# Patient Record
Sex: Male | Born: 1985 | Race: White | Hispanic: No | Marital: Married | Smoking: Never smoker
Health system: Southern US, Community
[De-identification: ages and names within clinical notes are randomized; demographics above are authoritative.]

## PROBLEM LIST (undated history)

## (undated) DIAGNOSIS — K219 Gastro-esophageal reflux disease without esophagitis: Secondary | ICD-10-CM

## (undated) DIAGNOSIS — R519 Headache, unspecified: Secondary | ICD-10-CM

## (undated) DIAGNOSIS — R51 Headache: Secondary | ICD-10-CM

## (undated) HISTORY — PX: FRACTURE SURGERY: SHX138

## (undated) HISTORY — PX: WISDOM TOOTH EXTRACTION: SHX21

---

## 2006-07-24 ENCOUNTER — Emergency Department (HOSPITAL_COMMUNITY): Admission: EM | Admit: 2006-07-24 | Discharge: 2006-07-25 | Payer: Self-pay | Admitting: Emergency Medicine

## 2008-04-19 ENCOUNTER — Emergency Department: Payer: Self-pay | Admitting: Emergency Medicine

## 2013-02-27 ENCOUNTER — Encounter: Payer: Self-pay | Admitting: Internal Medicine

## 2013-02-27 ENCOUNTER — Ambulatory Visit (INDEPENDENT_AMBULATORY_CARE_PROVIDER_SITE_OTHER): Payer: BC Managed Care – PPO | Admitting: Internal Medicine

## 2013-02-27 VITALS — BP 130/68 | HR 84 | Temp 98.3°F | Resp 18 | Ht 68.0 in | Wt 257.2 lb

## 2013-02-27 DIAGNOSIS — E669 Obesity, unspecified: Secondary | ICD-10-CM

## 2013-02-27 DIAGNOSIS — R5381 Other malaise: Secondary | ICD-10-CM

## 2013-02-27 DIAGNOSIS — Z13220 Encounter for screening for lipoid disorders: Secondary | ICD-10-CM

## 2013-02-27 DIAGNOSIS — R5383 Other fatigue: Secondary | ICD-10-CM

## 2013-02-27 DIAGNOSIS — R6889 Other general symptoms and signs: Secondary | ICD-10-CM

## 2013-02-27 DIAGNOSIS — R196 Halitosis: Secondary | ICD-10-CM

## 2013-02-27 NOTE — Patient Instructions (Addendum)
Return for fasting labs (make appt w front desk) fasting = no food or caloried beverages for 8 hours prior to blood test   Consider trying a PPR (omeprazole 20 mg or zegerid 20/1100 both available OTC) for reflux and to see if it imprroves your breath  Alternative:  We can schedule a swallow evaluation  To rule out esophageal diverticulum as the cause  You can lose 10%  Of your current body weight over the next 6 months   This is  my version of a  "Low GI"  Diet:  It is not ultra low carb, but will still lower your blood sugars and allow you to lose 5 to 10 lbs per month if you follow it carefully. All of the foods can be found at grocery stores and in bulk at [employer].  The Atkins protein bars and shakes are available in more varieties at [employer], [employer] and [employer].     7 AM Breakfast:  Low carbohydrate Protein  Shakes (I recommend the EAS AdvantEdge "Carb Control" shakes  Or the low carb shakes by Atkins.   Both are available everywhere:  In  cases at [employer]Js  Or in 4 packs at grocery stores and pharmacies  2.5 carbs  (Alternative is  a toasted Arnold's Sandwhich Thin w/ peanut butter, a "Bagel Thin" with cream cheese and salmon) or  a scrambled egg burrito made with a low carb tortilla .  Avoid cereal and bananas, oatmeal too unless you are cooking the old fashioned kind that takes 30-40 minutes to prepare.  the rest is overly processed, has minimal fiber, and is loaded with carbohydrates!   10 AM: Protein bar by Atkins (the snack size, under 200 cal).  There are many varieties , available widely again or in bulk in limited varieties at BJs)  Other so called "protein bars" tend to be loaded with carbohydrates.  Remember, in food advertising, the word "energy" is synonymous for " carbohydrate."  Lunch: sandwich of [place], (or any lunchmeat, grilled meat or canned tuna), fresh avocado, mayonnaise  and cheese on a lower carbohydrate pita bread, flatbread, or tortilla . Ok to use regular  mayonnaise. The bread is the only source or carbohydrate that can be decreased (Joseph's makes a pita bread and a flat bread that are 50 cal and 4 net carbs ; Toufayan makes a low carb flatbread that's 100 cal and 9 net carbs  and  Mission makes a low carb whole wheat tortilla  That is 210 cal and 6 net carbs)  3 PM:  Mid day :  Another protein bar,  Or a  cheese stick (100 cal, 0 carbs),  Or 1 ounce of  almonds, walnuts, pistachios, pecans, peanuts,  Macadamia nuts. Or a Dannon light n Fit greek yogurt, 80 cal 8 net carbs . Avoid "granola"; the dried cranberries and raisins are loaded with carbohydrates. Mixed nuts ok if no raisins or cranberries or dried fruit.      6 PM  Dinner:  "mean and green:"  Meat/chicken/fish or a high protein legume; , with a green salad, and a low GI  Veggie (broccoli, cauliflower, green beans, spinach, brussel sprouts. Lima beans) : Avoid "Low fat dressings, as well as Reyne Dumas and [address]! They are loaded with sugar! Instead use ranch, vinagrette,  Blue cheese, etc.  There is a low carb pasta by Dreamfield's available at [facility] that is acceptable and tastes great. Try Kai Levins Angel's chicken piccata over low carb  pasta. The chicken dish is 0 carbs, and can be found in frozen section at BJs and [employer]. Also try [employer]" (pulled pork, no sauce,  0 carbs) and his pot roast.   both are in the refrigerated section at BJs   Dreamfield's makes a low carb pasta only 5 g/serving.  Available at all grocery stores,  And tastes like normal pasta  9 PM snack : Breyer's "low carb" fudgsicle or  ice cream bar (Carb Smart line), or  Weight Watcher's ice cream bar , or another "no sugar added" ice cream;a serving of fresh berries/cherries with whipped cream (Avoid bananas, pineapple, grapes  and watermelon on a regular basis because they are high in sugar)   Remember that snack Substitutions should be less than 10 carbs per serving and meals < 20 carbs.  Remember to subtract fiber grams and sugar alcohols to get the "net carbs."

## 2013-02-27 NOTE — Progress Notes (Signed)
Patient ID: Perry Morris, male   DOB: Oct 02, 1986, 27 y.o.   MRN: [id number]   .  Patient Active Problem List  Diagnosis  . Obesity, unspecified  . Halitosis    Subjective:  CC:   Chief Complaint  Patient presents with  . Establish Care    HPI:   Perry Morris is a 27 y.o. male who presents as a new patient to establish primary care with the chief complaint of    Weight gain..  Patient states that this is her heaviest weight he has ever been.  He has had weight problems in the past but has been able to lose 20 lbs  With diet and exercise .  He cites recent fatherhood and change in activity level as contributors. He also has had a job which includes quite a bit of travel which only has recently changed since he has become a father.  He denies dry skin , constipation and other thyroid symptoms. He has no joint pain snoring or daytime somnolence.  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   2) halitosis. He is continually bothered by his address and his wife does notice it as well. He has had both ENT and dental evaluations and found no social support. He does brush his tongue regularly when he brushes his teeth he also losses. .      History reviewed. No pertinent past medical history.  Past Surgical History  Procedure Laterality Date  . Wisdom tooth extraction      Family History  Problem Relation Age of Onset  . Stroke Father 65    thrombotic,  carotid artery   . Cancer Brother 16    lymphoma    History   Social History  . Marital Status: Single    Spouse Name: N/A    Number of Children: N/A  . Years of Education: N/A   Occupational History  . Not on file.   Social History Main Topics  . Smoking status: Never Smoker   . Smokeless tobacco: Not on file  . Alcohol Use: Yes  . Drug Use: No  . Sexually Active: Not on file   Other Topics Concern  .  Not on file   Social History Narrative  . No narrative on file     No Known Allergies    Review of Systems:   Patient denies headache, fevers, malaise, unintentional weight loss, skin rash, eye pain, sinus congestion and sinus pain, sore throat, dysphagia,  hemoptysis , cough, dyspnea, wheezing, chest pain, palpitations, orthopnea, edema, abdominal pain, nausea, melena, diarrhea, constipation, flank pain, dysuria, hematuria, urinary  Frequency, nocturia, numbness, tingling, seizures,  Focal weakness, Loss of consciousness,  Tremor, insomnia, depression, anxiety, and suicidal ideation.     Objective:  BP 130/68  Pulse 84  Temp(Src) 98.3 F (36.8 C) (Oral)  Resp 18  Ht 5\' 8"  (1.727 m)  Wt 257 lb 4 oz (116.688 kg)  BMI 39.12 kg/m2  SpO2 98%  General appearance: alert, cooperative and appears stated age Ears: normal TM's and external ear canals both ears Throat: lips, mucosa, and tongue normal; teeth and gums normal Neck: no adenopathy, no carotid bruit, supple, symmetrical, trachea midline and thyroid not enlarged, symmetric, no tenderness/mass/nodules Back: symmetric, no curvature. ROM normal. No CVA tenderness. Lungs: clear to auscultation bilaterally Heart: regular rate and rhythm, S1, S2 normal, no murmur,  click, rub or gallop Abdomen: soft, non-tender; bowel sounds normal; no masses,  no organomegaly Pulses: 2+ and symmetric Skin: Skin color, texture, turgor normal. No rashes or lesions Lymph nodes: Cervical, supraclavicular, and axillary nodes normal.  Assessment and Plan:  Obesity, unspecified BMI is 39.I have addressed  BMI and recommended a low glycemic index diet utilizing smaller more frequent meals to increase metabolism.  I have also recommended that patient start exercising with a goal of 30 minutes of aerobic exercise a minimum of 5 days per week. Screening for lipid disorders, thyroid and diabetes to be done today.    Halitosis Persistent, despite regular  dental evaluations. He brushes his tongue when he brushes his teeth. He has no history of 2 CK. He has been doing some reading online and Is inquiring about whether changing his pH will help [place] halitosis,  I've reassured him that his body has a pH that is important for maintenance of normal function and should not be altered with medications. I have suggested that he try a refund flux medication given that he does have episodes of reflux. If trial of PPI does not help we discussed ordering a  UGI study to rule out esophageal diverticulum.   Updated Medication List No outpatient encounter prescriptions on file as of 02/27/2013.   No facility-administered encounter medications on file as of 02/27/2013.     Orders Placed This Encounter  Procedures  . Comprehensive metabolic panel  . TSH  . Lipid panel  . CBC with Differential    No Follow-up on file.

## 2013-02-28 ENCOUNTER — Encounter: Payer: Self-pay | Admitting: Internal Medicine

## 2013-02-28 DIAGNOSIS — R196 Halitosis: Secondary | ICD-10-CM | POA: Insufficient documentation

## 2013-02-28 DIAGNOSIS — E669 Obesity, unspecified: Secondary | ICD-10-CM | POA: Insufficient documentation

## 2013-02-28 NOTE — Assessment & Plan Note (Addendum)
Persistent, despite regular dental evaluations. He brushes his tongue when he brushes his teeth. He has no history of 2 CK. He has been doing some reading online and Is inquiring about whether changing his pH will help [place] halitosis,  I've reassured him that his body has a pH that is important for maintenance of normal function and should not be altered with medications. I have suggested that he try a refund flux medication given that he does have episodes of reflux. If trial of PPI does not help we discussed ordering a  UGI study to rule out esophageal diverticulum.

## 2013-02-28 NOTE — Assessment & Plan Note (Signed)
BMI is 39.I have addressed  BMI and recommended a low glycemic index diet utilizing smaller more frequent meals to increase metabolism.  I have also recommended that patient start exercising with a goal of 30 minutes of aerobic exercise a minimum of 5 days per week. Screening for lipid disorders, thyroid and diabetes to be done today.

## 2013-03-17 ENCOUNTER — Other Ambulatory Visit (INDEPENDENT_AMBULATORY_CARE_PROVIDER_SITE_OTHER): Payer: BC Managed Care – PPO

## 2013-03-17 DIAGNOSIS — R5383 Other fatigue: Secondary | ICD-10-CM

## 2013-03-17 DIAGNOSIS — Z13220 Encounter for screening for lipoid disorders: Secondary | ICD-10-CM

## 2013-03-17 DIAGNOSIS — E669 Obesity, unspecified: Secondary | ICD-10-CM

## 2013-03-17 DIAGNOSIS — R5381 Other malaise: Secondary | ICD-10-CM

## 2013-03-17 LAB — CBC WITH DIFFERENTIAL/PLATELET
Basophils Absolute: 0 10*3/uL (ref 0.0–0.1)
Basophils Relative: 0.5 % (ref 0.0–3.0)
Eosinophils Absolute: 0.1 10*3/uL (ref 0.0–0.7)
HCT: 45.2 % (ref 39.0–52.0)
Hemoglobin: 15.7 g/dL (ref 13.0–17.0)
Lymphs Abs: 2.4 10*3/uL (ref 0.7–4.0)
MCHC: 34.7 g/dL (ref 30.0–36.0)
MCV: 88.4 fl (ref 78.0–100.0)
Monocytes Absolute: 0.6 10*3/uL (ref 0.1–1.0)
Neutro Abs: 3 10*3/uL (ref 1.4–7.7)
RBC: 5.11 Mil/uL (ref 4.22–5.81)
RDW: 12.8 % (ref 11.5–14.6)

## 2013-03-17 LAB — LIPID PANEL
Cholesterol: 151 mg/dL (ref 0–200)
LDL Cholesterol: 91 mg/dL (ref 0–99)
Triglycerides: 71 mg/dL (ref 0.0–149.0)

## 2013-03-17 LAB — COMPREHENSIVE METABOLIC PANEL
AST: 24 U/L (ref 0–37)
BUN: 14 mg/dL (ref 6–23)
Calcium: 9 mg/dL (ref 8.4–10.5)
Chloride: 105 mEq/L (ref 96–112)
Creatinine, Ser: 1.1 mg/dL (ref 0.4–1.5)
GFR: 86.17 mL/min (ref >60.00)
Glucose, Bld: 99 mg/dL (ref 70–99)

## 2013-03-17 LAB — TSH: TSH: 1.37 u[IU]/mL (ref 0.35–5.50)

## 2013-03-18 ENCOUNTER — Encounter: Payer: Self-pay | Admitting: Internal Medicine

## 2013-03-19 ENCOUNTER — Encounter: Payer: Self-pay | Admitting: Internal Medicine

## 2013-03-21 ENCOUNTER — Encounter: Payer: Self-pay | Admitting: Internal Medicine

## 2013-03-27 NOTE — Telephone Encounter (Signed)
Patient has appointment scheduled 10/14.

## 2013-08-01 ENCOUNTER — Ambulatory Visit (INDEPENDENT_AMBULATORY_CARE_PROVIDER_SITE_OTHER): Payer: BC Managed Care – PPO | Admitting: Internal Medicine

## 2013-08-01 ENCOUNTER — Encounter: Payer: Self-pay | Admitting: Internal Medicine

## 2013-08-01 VITALS — BP 120/80 | HR 80 | Temp 98.8°F | Resp 14 | Ht 68.0 in | Wt 253.0 lb

## 2013-08-01 DIAGNOSIS — B3749 Other urogenital candidiasis: Secondary | ICD-10-CM

## 2013-08-01 DIAGNOSIS — B3742 Candidal balanitis: Secondary | ICD-10-CM

## 2013-08-01 DIAGNOSIS — Z23 Encounter for immunization: Secondary | ICD-10-CM

## 2013-08-01 MED ORDER — NYSTATIN 100000 UNIT/GM EX POWD: CUTANEOUS | Status: DC

## 2013-08-01 MED ORDER — SULFAMETHOXAZOLE-TMP DS 800-160 MG PO TABS: 1.0000 | ORAL_TABLET | Freq: Two times a day (BID) | ORAL | Status: DC

## 2013-08-01 MED ORDER — FLUCONAZOLE 150 MG PO TABS: 150.0000 mg | ORAL_TABLET | Freq: Every day | ORAL | Status: DC

## 2013-08-01 NOTE — Patient Instructions (Addendum)
I am treating you for a fungal infection with oral and topical medications  The septra should be taken after you take the 2 days of fluconazole  This will cover any bacteraial skin infection   You can try using cool compresses for the pain if needed

## 2013-08-03 DIAGNOSIS — B3742 Candidal balanitis: Secondary | ICD-10-CM | POA: Insufficient documentation

## 2013-08-03 NOTE — Progress Notes (Signed)
Patient ID: Perry Morris, male   DOB: Aug 25, 1986, 27 y.o.   MRN: [id number]  Patient Active Problem List   Diagnosis Date Noted  . Candidal balanitis 08/03/2013  . Obesity, unspecified 02/28/2013  . Halitosis 02/28/2013    Subjective:  CC:   Chief Complaint  Patient presents with  . Acute Visit  . Rash    groin area    HPI:   Perry Morris a 27 y.o. male who presents Penile irritation. Patient states that for the last several weeks he has noticed irritation and redness along the shaft of his penis that irritation has been done right painful at times. He does wear spandex style underwear and sleeps in at night as well. He does exercise regularly and has noted that he stays moist that area due to exercise. He's tried some over-the-counter antifungals with no improvement. He is sexually active but he has only one partner and his wife and he just recently a child. There's no marital infidelity suspected. He denies any history of herpes. He's noticed no blistering lesions.    No past medical history on file.  Past Surgical History  Procedure Laterality Date  . Wisdom tooth extraction         The following portions of the patient's history were reviewed and updated as appropriate: Allergies, current medications, and problem list.    Review of Systems:   12 Pt  review of systems was negative except those addressed in the HPI,     History   Social History  . Marital Status: Single    Spouse Name: N/A    Number of Children: N/A  . Years of Education: N/A   Occupational History  . Not on file.   Social History Main Topics  . Smoking status: Never Smoker   . Smokeless tobacco: Never Used  . Alcohol Use: Yes  . Drug Use: No  . Sexual Activity: Not on file   Other Topics Concern  . Not on file   Social History Narrative  . No narrative on file    Objective:  Filed Vitals:   08/01/13 1632  BP: 120/80  Pulse: 80  Temp: 98.8 F (37.1 C)  Resp:  14     General appearance: alert, cooperative and appears stated age Ears: normal TM's and external ear canals both ears Throat: lips, mucosa, and tongue normal; teeth and gums normal Neck: no adenopathy, no carotid bruit, supple, symmetrical, trachea midline and thyroid not enlarged, symmetric, no tenderness/mass/nodules Back: symmetric, no curvature. ROM normal. No CVA tenderness. Lungs: clear to auscultation bilaterally Heart: regular rate and rhythm, S1, S2 normal, no murmur, click, rub or gallop Abdomen: soft, non-tender; bowel sounds normal; no masses,  no organomegaly Pulses: 2+ and symmetric Skin: nonvesicular erythematous rash on shaft of penis.  No inguinal LAD>  Lymph nodes: Cervical, supraclavicular, and axillary nodes normal.  Assessment and Plan:  Candidal balanitis We'll treat empirically with 2 days of oral fluconazole followed by use of nystatin powder to 4 times daily. Will cover also for MRSA cellulitis  since his wife is a Scientist, forensic and he works out at a gym.with one week of empiric Septra. Advised to switch to cotton underwear to avoid persistent dampness of inguinal area.   Updated Medication List Outpatient Encounter Prescriptions as of 08/01/2013  Medication Sig Dispense Refill  . fluconazole (DIFLUCAN) 150 MG tablet Take 1 tablet (150 mg total) by mouth daily.  2 tablet  0  . nystatin (MYCOSTATIN/NYSTOP)  100000 UNIT/GM POWD Apply topically to affected area twice daily  30 g  1  . sulfamethoxazole-trimethoprim (BACTRIM DS) 800-160 MG per tablet Take 1 tablet by mouth 2 (two) times daily.  14 tablet  0   No facility-administered encounter medications on file as of 08/01/2013.     Orders Placed This Encounter  Procedures  . Flu Vaccine QUAD 36+ mos PF IM (Fluarix)    No Follow-up on file.

## 2013-08-03 NOTE — Assessment & Plan Note (Signed)
We'll treat empirically with 2 days of oral fluconazole followed by use of nystatin powder to 4 times daily. Will cover also for MRSA cellulitis  since his wife is a Scientist, forensic and he works out at a gym.with one week of empiric Septra.

## 2013-08-04 ENCOUNTER — Encounter: Payer: Self-pay | Admitting: Internal Medicine

## 2013-08-05 ENCOUNTER — Encounter: Payer: Self-pay | Admitting: Internal Medicine

## 2013-08-05 DIAGNOSIS — B3742 Candidal balanitis: Secondary | ICD-10-CM

## 2013-08-05 MED ORDER — ACYCLOVIR 400 MG PO TABS: 400.0000 mg | ORAL_TABLET | Freq: Three times a day (TID) | ORAL | Status: DC

## 2013-08-05 MED ORDER — PREDNISONE (PAK) 10 MG PO TABS: ORAL_TABLET | ORAL | Status: DC

## 2013-08-05 NOTE — Assessment & Plan Note (Signed)
No improvement in 72 hours with bactrim and nystatin.  Changing to prednisone and acyclovir

## 2013-08-29 ENCOUNTER — Ambulatory Visit: Payer: BC Managed Care – PPO | Admitting: Internal Medicine

## 2016-07-03 ENCOUNTER — Other Ambulatory Visit: Payer: Self-pay | Admitting: Orthopedic Surgery

## 2016-07-03 DIAGNOSIS — S8263XA Displaced fracture of lateral malleolus of unspecified fibula, initial encounter for closed fracture: Secondary | ICD-10-CM | POA: Insufficient documentation

## 2016-07-11 ENCOUNTER — Encounter
Admission: RE | Admit: 2016-07-11 | Discharge: 2016-07-11 | Disposition: A | Discharge status: Home / Self Care | Payer: BLUE CROSS/BLUE SHIELD | Source: Ambulatory Visit | Attending: Orthopedic Surgery | Admitting: Orthopedic Surgery

## 2016-07-11 HISTORY — DX: Headache, unspecified: R51.9

## 2016-07-11 HISTORY — DX: Headache: R51

## 2016-07-11 HISTORY — DX: Gastro-esophageal reflux disease without esophagitis: K21.9

## 2016-07-11 NOTE — Patient Instructions (Signed)
  Your procedure is scheduled on: 07-13-16 [facility](THURSDAY) Report to Same Day Surgery 2nd floor medical mall To find out your arrival time please call [phone] between 1PM - 3PM on 07-12-16 [facility](WEDNESDAY)  Remember: Instructions that are not followed completely may result in serious medical risk, up to and including death, or upon the discretion of your surgeon and anesthesiologist your surgery may need to be rescheduled.    _x___ 1. Do not eat food or drink liquids after midnight. No gum chewing or hard candies.     __x__ 2. No Alcohol for 24 hours before or after surgery.   __x__3. No Smoking for 24 prior to surgery.   ____  4. Bring all medications with you on the day of surgery if instructed.    __x__ 5. Notify your doctor if there is any change in your medical condition     (cold, fever, infections).     Do not wear jewelry, make-up, hairpins, clips or nail polish.  Do not wear lotions, powders, or perfumes. You may wear deodorant.  Do not shave 48 hours prior to surgery. Men may shave face and neck.  Do not bring valuables to the hospital.    [facility] any belongings or valuables.               Contacts, dentures or bridgework may not be worn into surgery.  Leave your suitcase in the car. After surgery it may be brought to your room.  For patients admitted to the hospital, discharge time is determined by your treatment team.   Patients discharged the day of surgery will not be allowed to drive home.    Please read over the following fact sheets that you were given:   [facility] Preparing for Surgery and or MRSA Information   ____ Take these medicines the morning of surgery with A SIP OF WATER:    1. NONE  2.  3.  4.  5.  6.  ____ Fleet Enema (as directed)   _x___ Use CHG Soap or sage wipes as directed on instruction sheet   ____ Use inhalers on the day of surgery and bring to hospital day of surgery  ____ Stop metformin 2 days prior to  surgery    ____ Take 1/2 of usual insulin dose the night before surgery and none on the morning of  surgery.   ____ Stop aspirin or coumadin, or plavix  _x__ Stop Anti-inflammatories such as Advil, Aleve, Ibuprofen, Motrin, Naproxen,          Naprosyn, Goodies powders or aspirin products. Ok to take Tylenol.   ____ Stop supplements until after surgery.    ____ Bring C-Pap to the hospital.

## 2016-07-12 ENCOUNTER — Encounter
Admission: RE | Admit: 2016-07-12 | Discharge: 2016-07-12 | Disposition: A | Discharge status: Home / Self Care | Payer: BLUE CROSS/BLUE SHIELD | Source: Ambulatory Visit | Attending: Orthopedic Surgery | Admitting: Orthopedic Surgery

## 2016-07-12 DIAGNOSIS — S8262XA Displaced fracture of lateral malleolus of left fibula, initial encounter for closed fracture: Secondary | ICD-10-CM | POA: Diagnosis present

## 2016-07-12 DIAGNOSIS — X501XXA Overexertion from prolonged static or awkward postures, initial encounter: Secondary | ICD-10-CM | POA: Diagnosis not present

## 2016-07-12 DIAGNOSIS — K219 Gastro-esophageal reflux disease without esophagitis: Secondary | ICD-10-CM | POA: Diagnosis not present

## 2016-07-12 DIAGNOSIS — G43909 Migraine, unspecified, not intractable, without status migrainosus: Secondary | ICD-10-CM | POA: Diagnosis not present

## 2016-07-12 DIAGNOSIS — Z823 Family history of stroke: Secondary | ICD-10-CM | POA: Diagnosis not present

## 2016-07-12 DIAGNOSIS — Y9379 Activity, other specified sports and athletics: Secondary | ICD-10-CM | POA: Diagnosis not present

## 2016-07-12 DIAGNOSIS — Z807 Family history of other malignant neoplasms of lymphoid, hematopoietic and related tissues: Secondary | ICD-10-CM | POA: Diagnosis not present

## 2016-07-12 DIAGNOSIS — S93432A Sprain of tibiofibular ligament of left ankle, initial encounter: Secondary | ICD-10-CM | POA: Diagnosis not present

## 2016-07-12 DIAGNOSIS — Y929 Unspecified place or not applicable: Secondary | ICD-10-CM | POA: Diagnosis not present

## 2016-07-12 LAB — BASIC METABOLIC PANEL
Anion gap: 5 (ref 5–15)
BUN: 22 mg/dL — ABNORMAL HIGH (ref 6–20)
CALCIUM: 9.3 mg/dL (ref 8.9–10.3)
CO2: 29 mmol/L (ref 22–32)
CREATININE: 1.15 mg/dL (ref 0.61–1.24)
Chloride: 104 mmol/L (ref 101–111)
GFR calc non Af Amer: >60 mL/min (ref >60)
Glucose, Bld: 93 mg/dL (ref 65–99)
Potassium: 4 mmol/L (ref 3.5–5.1)
SODIUM: 138 mmol/L (ref 135–145)

## 2016-07-12 LAB — CBC WITH DIFFERENTIAL/PLATELET
BASOS PCT: 0 %
Basophils Absolute: 0 10*3/uL (ref 0–0.1)
EOS ABS: 0.1 10*3/uL (ref 0–0.7)
Eosinophils Relative: 1 %
HCT: 45.4 % (ref 40.0–52.0)
HEMOGLOBIN: 16 g/dL (ref 13.0–18.0)
Lymphocytes Relative: 25 %
Lymphs Abs: 2.3 10*3/uL (ref 1.0–3.6)
MCH: 30.7 pg (ref 26.0–34.0)
MCHC: 35.3 g/dL (ref 32.0–36.0)
MCV: 87 fL (ref 80.0–100.0)
MONOS PCT: 8 %
Monocytes Absolute: 0.7 10*3/uL (ref 0.2–1.0)
NEUTROS PCT: 66 %
Neutro Abs: 6 10*3/uL (ref 1.4–6.5)
Platelets: 251 10*3/uL (ref 150–440)
RBC: 5.22 MIL/uL (ref 4.40–5.90)
RDW: 12.8 % (ref 11.5–14.5)
WBC: 9.2 10*3/uL (ref 3.8–10.6)

## 2016-07-12 LAB — PROTIME-INR
INR: 0.91
PROTHROMBIN TIME: 12.2 s (ref 11.4–15.2)

## 2016-07-12 LAB — APTT: APTT: 28 s (ref 24–36)

## 2016-07-13 ENCOUNTER — Ambulatory Visit
Admission: RE | Admit: 2016-07-13 | Discharge: 2016-07-13 | Disposition: A | Discharge status: Home / Self Care | Payer: BLUE CROSS/BLUE SHIELD | Source: Ambulatory Visit | Attending: Orthopedic Surgery | Admitting: Orthopedic Surgery

## 2016-07-13 ENCOUNTER — Encounter
Admission: RE | Disposition: A | Discharge status: Home / Self Care | Payer: Self-pay | Source: Ambulatory Visit | Attending: Orthopedic Surgery

## 2016-07-13 ENCOUNTER — Ambulatory Visit: Payer: BLUE CROSS/BLUE SHIELD

## 2016-07-13 ENCOUNTER — Encounter: Payer: Self-pay | Admitting: *Deleted

## 2016-07-13 ENCOUNTER — Ambulatory Visit: Payer: BLUE CROSS/BLUE SHIELD | Admitting: Certified Registered Nurse Anesthetist

## 2016-07-13 DIAGNOSIS — Z807 Family history of other malignant neoplasms of lymphoid, hematopoietic and related tissues: Secondary | ICD-10-CM | POA: Insufficient documentation

## 2016-07-13 DIAGNOSIS — Y929 Unspecified place or not applicable: Secondary | ICD-10-CM | POA: Insufficient documentation

## 2016-07-13 DIAGNOSIS — X501XXA Overexertion from prolonged static or awkward postures, initial encounter: Secondary | ICD-10-CM | POA: Insufficient documentation

## 2016-07-13 DIAGNOSIS — S82892A Other fracture of left lower leg, initial encounter for closed fracture: Secondary | ICD-10-CM

## 2016-07-13 DIAGNOSIS — K219 Gastro-esophageal reflux disease without esophagitis: Secondary | ICD-10-CM | POA: Insufficient documentation

## 2016-07-13 DIAGNOSIS — Y9379 Activity, other specified sports and athletics: Secondary | ICD-10-CM | POA: Insufficient documentation

## 2016-07-13 DIAGNOSIS — Z823 Family history of stroke: Secondary | ICD-10-CM | POA: Insufficient documentation

## 2016-07-13 DIAGNOSIS — G43909 Migraine, unspecified, not intractable, without status migrainosus: Secondary | ICD-10-CM | POA: Insufficient documentation

## 2016-07-13 DIAGNOSIS — S8262XA Displaced fracture of lateral malleolus of left fibula, initial encounter for closed fracture: Secondary | ICD-10-CM | POA: Diagnosis not present

## 2016-07-13 DIAGNOSIS — S93432A Sprain of tibiofibular ligament of left ankle, initial encounter: Secondary | ICD-10-CM | POA: Insufficient documentation

## 2016-07-13 HISTORY — PX: ORIF ANKLE FRACTURE: SHX5408

## 2016-07-13 SURGERY — OPEN REDUCTION INTERNAL FIXATION (ORIF) ANKLE FRACTURE
Anesthesia: General | Laterality: Left

## 2016-07-13 MED ORDER — BUPIVACAINE HCL (PF) 0.25 % IJ SOLN
INTRAMUSCULAR | Status: AC
  Filled 2016-07-13: qty 30

## 2016-07-13 MED ORDER — NEOMYCIN-POLYMYXIN B GU 40-200000 IR SOLN
Status: DC | PRN
  Administered 2016-07-13: 4 mL

## 2016-07-13 MED ORDER — FENTANYL CITRATE (PF) 100 MCG/2ML IJ SOLN
INTRAMUSCULAR | Status: AC
  Administered 2016-07-13: 25 ug via INTRAVENOUS
  Filled 2016-07-13: qty 2

## 2016-07-13 MED ORDER — ONDANSETRON HCL 4 MG/2ML IJ SOLN: 4.0000 mg | Freq: Once | INTRAMUSCULAR | Status: DC | PRN

## 2016-07-13 MED ORDER — LIDOCAINE HCL (CARDIAC) 20 MG/ML IV SOLN
INTRAVENOUS | Status: DC | PRN
  Administered 2016-07-13: 30 mg via INTRAVENOUS

## 2016-07-13 MED ORDER — ACETAMINOPHEN 10 MG/ML IV SOLN
INTRAVENOUS | Status: DC | PRN
  Administered 2016-07-13: 1000 mg via INTRAVENOUS

## 2016-07-13 MED ORDER — OXYCODONE HCL 5 MG PO TABS
ORAL_TABLET | ORAL | Status: AC
  Administered 2016-07-13: 10 mg
  Filled 2016-07-13: qty 2

## 2016-07-13 MED ORDER — BUPIVACAINE HCL 0.25 % IJ SOLN
INTRAMUSCULAR | Status: DC | PRN
  Administered 2016-07-13: 30 mL

## 2016-07-13 MED ORDER — ONDANSETRON HCL 4 MG PO TABS: 4.0000 mg | ORAL_TABLET | Freq: Three times a day (TID) | ORAL | 0 refills | Status: DC | PRN

## 2016-07-13 MED ORDER — NEOMYCIN-POLYMYXIN B GU 40-200000 IR SOLN
Status: AC
  Filled 2016-07-13: qty 4

## 2016-07-13 MED ORDER — MIDAZOLAM HCL 5 MG/5ML IJ SOLN
INTRAMUSCULAR | Status: DC | PRN
  Administered 2016-07-13: 1 mg via INTRAVENOUS

## 2016-07-13 MED ORDER — FAMOTIDINE 20 MG PO TABS
ORAL_TABLET | ORAL | Status: AC
  Filled 2016-07-13: qty 1

## 2016-07-13 MED ORDER — CEFAZOLIN SODIUM-DEXTROSE 2-4 GM/100ML-% IV SOLN
2.0000 g | INTRAVENOUS | Status: AC
  Administered 2016-07-13: 2 g via INTRAVENOUS

## 2016-07-13 MED ORDER — CHLORHEXIDINE GLUCONATE CLOTH 2 % EX PADS: 6.0000 | MEDICATED_PAD | Freq: Once | CUTANEOUS | Status: DC

## 2016-07-13 MED ORDER — FAMOTIDINE 20 MG PO TABS
20.0000 mg | ORAL_TABLET | Freq: Once | ORAL | Status: AC
  Administered 2016-07-13: 20 mg via ORAL

## 2016-07-13 MED ORDER — ACETAMINOPHEN 10 MG/ML IV SOLN
INTRAVENOUS | Status: AC
  Filled 2016-07-13: qty 100

## 2016-07-13 MED ORDER — FENTANYL CITRATE (PF) 100 MCG/2ML IJ SOLN
INTRAMUSCULAR | Status: DC | PRN
  Administered 2016-07-13 (×6): 50 ug via INTRAVENOUS

## 2016-07-13 MED ORDER — FENTANYL CITRATE (PF) 100 MCG/2ML IJ SOLN
25.0000 ug | INTRAMUSCULAR | Status: DC | PRN
  Administered 2016-07-13 (×5): 25 ug via INTRAVENOUS

## 2016-07-13 MED ORDER — LACTATED RINGERS IV SOLN
INTRAVENOUS | Status: DC
  Administered 2016-07-13: 10:00:00 via INTRAVENOUS

## 2016-07-13 MED ORDER — ASPIRIN EC 325 MG PO TBEC: 325.0000 mg | DELAYED_RELEASE_TABLET | Freq: Two times a day (BID) | ORAL | 0 refills | Status: DC

## 2016-07-13 MED ORDER — ONDANSETRON HCL 4 MG/2ML IJ SOLN
INTRAMUSCULAR | Status: DC | PRN
  Administered 2016-07-13: 4 mg via INTRAVENOUS

## 2016-07-13 MED ORDER — DEXAMETHASONE SODIUM PHOSPHATE 10 MG/ML IJ SOLN
INTRAMUSCULAR | Status: DC | PRN
  Administered 2016-07-13: 10 mg via INTRAVENOUS

## 2016-07-13 MED ORDER — OXYCODONE HCL 5 MG PO TABS: 5.0000 mg | ORAL_TABLET | ORAL | 0 refills | Status: DC | PRN

## 2016-07-13 MED ORDER — PROPOFOL 10 MG/ML IV BOLUS
INTRAVENOUS | Status: DC | PRN
  Administered 2016-07-13: 200 mg via INTRAVENOUS

## 2016-07-13 MED ORDER — CEFAZOLIN SODIUM-DEXTROSE 2-4 GM/100ML-% IV SOLN
INTRAVENOUS | Status: AC
  Filled 2016-07-13: qty 100

## 2016-07-13 SURGICAL SUPPLY — 51 items
BANDAGE ELASTIC 4 LF NS (GAUZE/BANDAGES/DRESSINGS) ×6 IMPLANT
BLADE SURG 15 STRL LF DISP TIS (BLADE) ×1 IMPLANT
BLADE SURG 15 STRL SS (BLADE) ×3
BNDG ADH 5X4 AIR PERM ELC (GAUZE/BANDAGES/DRESSINGS) ×1
BNDG CMPR MED 5X4 ELC HKLP NS (GAUZE/BANDAGES/DRESSINGS) ×2
BNDG COHESIVE 4X5 WHT NS (GAUZE/BANDAGES/DRESSINGS) ×2 IMPLANT
BNDG ESMARK 6X12 TAN STRL LF (GAUZE/BANDAGES/DRESSINGS) ×3 IMPLANT
CLOSURE WOUND 1/2 X4 (GAUZE/BANDAGES/DRESSINGS)
CUFF TOURN 24 STER (MISCELLANEOUS) IMPLANT
CUFF TOURN 30 STER DUAL PORT (MISCELLANEOUS) ×2 IMPLANT
DRAPE FLUOR MINI C-ARM 54X84 (DRAPES) ×3 IMPLANT
DRAPE INCISE IOBAN 66X45 STRL (DRAPES) ×3 IMPLANT
DRAPE U-SHAPE 47X51 STRL (DRAPES) ×3 IMPLANT
DURAPREP 26ML APPLICATOR (WOUND CARE) ×6 IMPLANT
GAUZE PETRO XEROFOAM 1X8 (MISCELLANEOUS) ×3 IMPLANT
GAUZE SPONGE 4X4 12PLY STRL (GAUZE/BANDAGES/DRESSINGS) ×3 IMPLANT
GAUZE XEROFORM 4X4 STRL (GAUZE/BANDAGES/DRESSINGS) ×2 IMPLANT
GLOVE BIOGEL PI IND STRL 9 (GLOVE) ×1 IMPLANT
GLOVE BIOGEL PI INDICATOR 9 (GLOVE) ×2
GLOVE SURG 9.0 ORTHO LTXF (GLOVE) ×18 IMPLANT
GOWN STRL REUS TWL 2XL XL LVL4 (GOWN DISPOSABLE) ×3 IMPLANT
GOWN STRL REUS W/ TWL LRG LVL3 (GOWN DISPOSABLE) ×1 IMPLANT
GOWN STRL REUS W/TWL LRG LVL3 (GOWN DISPOSABLE) ×9
KIT RM TURNOVER STRD PROC AR (KITS) ×3 IMPLANT
LABEL OR SOLS (LABEL) ×3 IMPLANT
NS IRRIG 1000ML POUR BTL (IV SOLUTION) ×1 IMPLANT
PACK EXTREMITY ARMC (MISCELLANEOUS) ×3 IMPLANT
PAD ABD DERMACEA PRESS 5X9 (GAUZE/BANDAGES/DRESSINGS) ×6 IMPLANT
PAD CAST CTTN 4X4 STRL (SOFTGOODS) ×3 IMPLANT
PADDING CAST COTTON 4X4 STRL (SOFTGOODS) ×6
PLATE LCP 3.5 1/3 TUB 8HX93 (Plate) ×2 IMPLANT
REPAIR TROPE KNTLS SS SYNDESMO (Orthopedic Implant) ×2 IMPLANT
SCREW CANC FT 4.0X20 (Screw) ×4 IMPLANT
SCREW CANC FT ST SFS 4X16 (Screw) ×2 IMPLANT
SCREW CORTEX 3.5 12MM (Screw) ×2 IMPLANT
SCREW CORTEX 3.5 14MM (Screw) ×4 IMPLANT
SCREW CORTEX 3.5 16MM (Screw) ×2 IMPLANT
SCREW CORTEX 3.5 20MM (Screw) ×2 IMPLANT
SCREW LOCK CORT ST 3.5X12 (Screw) IMPLANT
SCREW LOCK CORT ST 3.5X14 (Screw) IMPLANT
SCREW LOCK CORT ST 3.5X16 (Screw) IMPLANT
SCREW LOCK CORT ST 3.5X20 (Screw) IMPLANT
SPLINT CAST 1 STEP 4X30 (MISCELLANEOUS) ×6 IMPLANT
SPONGE LAP 18X18 5 PK (GAUZE/BANDAGES/DRESSINGS) ×1 IMPLANT
STAPLER SKIN PROX 35W (STAPLE) ×3 IMPLANT
STOCKINETTE STRL 6IN 960660 (GAUZE/BANDAGES/DRESSINGS) ×3 IMPLANT
STRIP CLOSURE SKIN 1/2X4 (GAUZE/BANDAGES/DRESSINGS) ×2 IMPLANT
SUT VIC AB 2-0 SH 27 (SUTURE) ×3
SUT VIC AB 2-0 SH 27XBRD (SUTURE) ×2 IMPLANT
SYR 30ML LL (SYRINGE) ×1 IMPLANT
TAPE MICROPORE 2IN (TAPE) ×3 IMPLANT

## 2016-07-13 NOTE — Op Note (Signed)
07/13/2016  2:01 PM  PATIENT:  Perry Morris    PRE-OPERATIVE DIAGNOSIS:  Displaced fracture of the left lateral malleolus with syndesmotic disruption  POST-OPERATIVE DIAGNOSIS:  Same  PROCEDURE:  OPEN REDUCTION INTERNAL FIXATION (ORIF) LEFT ANKLE FRACTURE AND FIXATION OF LEFT SYNDESMOTIC DISRUPTION  SURGEON:  Thornton Park, MD  ASSISTANT:  Rockwell Germany, PA-S  [facility]  ANESTHESIA:   General and local with 0.25% marcaine plain    PREOPERATIVE INDICATIONS:  Perry Morris is a  30 y.o. male with a displaced fracture of the left lateral malleolus with syndesmotic disruption.  He has been having significant left ankle pain and instability. He has been unable to bear weight on the left lower extremity. This is affecting his activities of daily living and his ability to perform his job. I have recommended operative fixation for this injury and he has agreed. I have explained the details of the operation as well as the postoperative course.  I also discussed with him the risks and benefits of surgery. He understands the risks include but are not limited to infection, bleeding requiring blood transfusion, nerve or blood vessel injury, joint stiffness or loss of motion, persistent pain, weakness or instability, malunion, nonunion and hardware failure and the need for further surgery. Medical risks include but are not limited to DVT and pulmonary embolism, myocardial infarction, stroke, pneumonia, respiratory failure and death. Patient understood these risks and wished to proceed.   OPERATIVE IMPLANTS: Synthes 8 hole one third tubular plate and Arthrex ankle tightrope  OPERATIVE FINDINGS: Displaced oblique lateral malleolus fracture with instability of the syndesmosis  OPERATIVE PROCEDURE:   Patient was met in the preoperative area. The left leg was signed my initials and the word yes according the hospital's correct site of surgery protocol. He was brought to the operating room where he  underwent general anesthesia after an unsuccessful attempt at spinal placement. The patient was placed supine on the operative table. A bump was placed under the left hip. A tourniquet was applied to the left thigh.  The lower extremity was prepped and draped in a sterile fashion. A timeout was performed to verify the patient's name, date of birth, medical record number, correct site of surgery and correct procedure to be performed. It was also used to verify the patient received antibiotics, and that all appropriate instruments, implants and radiographic studies were available in the room. Once all in attendance were in agreement, the case began.  The left lower extremity was exsanguinated with an Esmarch. The tourniquet was inflated to 275 mmHg.  A lateral incision was made over the fibula. The subcutaneous tissues were dissected with the Metzenbaum scissor and pickup. Care was taken to avoid injury to the superficial peroneal nerve. The lateral malleolus fracture was identified and irrigated and fracture hematoma was removed. A curet was used to remove all scar tissue from the fracture site  Soft tissue was removed from the edges of the fracture site using a periosteal elevator. A fracture reduction clamp was then used to reduce the fracture to an anatomic position.  FluoroScan images were taken of the reduction. It was deemed at the fracture reduction was anatomic.     The lateral malleolus was then drilled in an AP direction, perpendicular to the fracture site with a 2.7 mm drill bit to allow for placement of the lag screw.   A single lag screw, 16 mm in length, was advanced across the fracture site by hand. This lag screw compressed the fracture.  An 8 hole, 1/3 tubular plate was then contoured to and placed along the lateral fibula. Three bicortical screws were placed proximal to the fracture and three fully threaded cancellus screws were placed distal the fracture. The fracture reduction and hardware  placement were confirmed on AP and lateral imaging.   Once the lateral malleolus was plated, the attention was turned to the syndesmosis. Stress views of the ankle following a lateral plate placement still revealed instability to the ankle with widening of the medial clear space. The decision was therefore made to place an ankle tightrope to stabilize the syndesmosis. The fourth hole from the proximal end of the one third tubular plate had been left open for placement of the tight rope.  A K wire from the Arthrex tight rope set was then drilled across the 4 cortices of the fibula and tibia. The position of this K wire was verified on FluoroScan imaging. Once adequate position was achieved, this was overdrilled with a cannulated drill from the Arthrex ankle tightrope set. The ankle tightrope was then shuttled across the tibia and fibula. The needle used for passage was brought out the skin overlying the medial ankle. A small stab incision was made to manipulate the button and allow it to sit flush against the lateral cortex of the medial tibia. The ankle was then dorsiflexed and the tight rope was tightened down using the sutures from the lateral side. The lateral button light flush against the one third tubular plate. The sutures were then tied over the lateral button. The suture tails from the medial and lateral tight rope were then cut with a suture scissor.  A stress test of the right ankle was then performed under fluoroscopy.  This test did not reveal any syndesmotic widening or opening of the medial clear space following placement of the tight rope.  The medial and lateral incisions were then copiously irrigated. The deep tissue was closed with 0 vicryl, the subcutaneous tissue was closed with 2-0 Vicryl and the skin approximated staples. 0.25% marcaine plain was then injected locally to help with post-operative pain.  A dry sterile dressing was applied along with an AO splint. The patient's ankle was  positioned in neutral. The pateint was then awoken from anesthesia, transferred to hospital bed and brought to the PACU in stable condition. I was scrubbed and present the entire case and all sharp and instrument counts were correct at conclusion the case. I spoke to the patient's family in the post-op consultation room to let them know the case was performed without complication and the patient was stable in recovery room.    Timoteo Gaul, MD

## 2016-07-13 NOTE — Anesthesia Procedure Notes (Signed)
Procedure Name: LMA Insertion Performed by: Ginger CarneMICHELET, [place] Pre-anesthesia Checklist: Patient identified, Patient being monitored, Timeout performed, Emergency Drugs available and Suction available Patient Re-evaluated:Patient Re-evaluated prior to inductionOxygen Delivery Method: Circle system utilized Preoxygenation: Pre-oxygenation with 100% oxygen Intubation Type: IV induction Ventilation: Mask ventilation without difficulty LMA: LMA inserted LMA Size: 4.5 Tube type: Oral Number of attempts: 1 Placement Confirmation: positive ETCO2 and breath sounds checked- equal and bilateral Tube secured with: Tape Dental Injury: Teeth and Oropharynx as per pre-operative assessment

## 2016-07-13 NOTE — Progress Notes (Signed)
Dressing clean and dry on discharge. Pt states some relief from pain pills and feels that he wants to go home at this time

## 2016-07-13 NOTE — H&P (Signed)
PREOPERATIVE H&P  Chief Complaint: Left ankle pain and instability  HPI: Perry Morris is a 30 y.o. male who presents for preoperative history and physical with a diagnosis of a left lateral malleolus fracture and syndesmotic injury after landing awkwardly while jumping.  Patient isn't able to bear weight on his left lower extremity. This is significantly impairing activities of daily living and ability to perform his job.  He has elected for surgical fixation.   Past Medical History:  Diagnosis Date  . GERD (gastroesophageal reflux disease)    rare-no meds  . Headache    h/o migraines   Past Surgical History:  Procedure Laterality Date  . WISDOM TOOTH EXTRACTION     Social History   Social History  . Marital status: Married    Spouse name: N/A  . Number of children: N/A  . Years of education: N/A   Social History Main Topics  . Smoking status: Never Smoker  . Smokeless tobacco: Never Used  . Alcohol use No  . Drug use: No  . Sexual activity: Not Asked   Other Topics Concern  . None   Social History Narrative  . None   Family History  Problem Relation Age of Onset  . Stroke Father 2260    thrombotic,  carotid artery   . Cancer Brother 16    lymphoma   No Known Allergies Prior to Admission medications   Not on File     Positive ROS: All other systems have been reviewed and were otherwise negative with the exception of those mentioned in the HPI and as above.  Physical Exam: General: Alert, no acute distress Cardiovascular: Regular rate and rhythm, no murmurs rubs or gallops.  No pedal edema Respiratory: Clear to auscultation bilaterally, no wheezes rales or rhonchi. No cyanosis, no use of accessory musculature GI: No organomegaly, abdomen is soft and non-tender nondistended with positive bowel sounds. Skin: Skin intact, no lesions within the operative field. Neurologic: Sensation intact distally Psychiatric: Patient is competent for consent with normal  mood and affect Lymphatic: No cervical lymphadenopathy  MUSCULOSKELETAL: Left ankle:  Skin intact.  Can flex and extend toes.  Toes well perfused.  Sensation intact to light touch.    Assessment: Lateral malleolus fracture with syndesmotic injury  Plan: Plan for Procedure(s): LEFT OPEN REDUCTION INTERNAL FIXATION (ORIF) ANKLE FRACTURE WITH SYNDESMOTIC FIXATION  I have recommended that the patient have his ankle fracture fixed.  The lateral malleolus fracture is displaced. His x-rays also demonstrate widening of the medial clear space and suggest widening between the distal tibia and fibula consistent with a syndesmotic injury. I recommended plate fixation of the fibula and syndesmotic tightrope to stabilize the syndesmosis. I explained to the patient the details of the operation as well as the postoperative course.  I also discussed the risks and benefits of surgery. The patient and his family understand the risks include but are not limited to infection, bleeding requiring blood transfusion, nerve or blood vessel injury, joint stiffness or loss of motion, persistent pain, weakness or instability, malunion, nonunion and hardware failure and the need for further surgery. Medical risks include but are not limited to DVT and pulmonary embolism, myocardial infarction, stroke, pneumonia, respiratory failure and death. Patient understood these risks and wished to proceed.   Juanell FairlyKRASINSKI, Jamiyla Ishee, MD   07/13/2016 11:05 AM

## 2016-07-13 NOTE — Anesthesia Procedure Notes (Signed)
Procedure Name: LMA Insertion Date/Time: 07/13/2016 11:41 AM Performed by: Ginger CarneMICHELET, Amarilis Belflower Pre-anesthesia Checklist: Patient identified, Emergency Drugs available, Suction available, Patient being monitored and Timeout performed Patient Re-evaluated:Patient Re-evaluated prior to inductionOxygen Delivery Method: Circle system utilized Preoxygenation: Pre-oxygenation with 100% oxygen Intubation Type: IV induction LMA: LMA inserted LMA Size: 4.5 Tube type: Oral Number of attempts: 1 Placement Confirmation: positive ETCO2 and breath sounds checked- equal and bilateral Tube secured with: Tape Dental Injury: Teeth and Oropharynx as per pre-operative assessment

## 2016-07-13 NOTE — Discharge Instructions (Signed)
AMBULATORY SURGERY  °DISCHARGE INSTRUCTIONS ° ° °1) The drugs that you were given will stay in your system until tomorrow so for the next 24 hours you should not: ° °A) Drive an automobile °B) Make any legal decisions °C) Drink any alcoholic beverage ° ° °2) You may resume regular meals tomorrow.  Today it is better to start with liquids and gradually work up to solid foods. ° °You may eat anything you prefer, but it is better to start with liquids, then soup and crackers, and gradually work up to solid foods. ° ° °3) Please notify your doctor immediately if you have any unusual bleeding, trouble breathing, redness and pain at the surgery site, drainage, fever, or pain not relieved by medication. ° ° ° °4) Additional Instructions: ° ° ° ° ° ° ° °Please contact your physician with any problems or Same Day Surgery at [phone], Monday through Friday 6 am to 4 pm, or [facility] at [facility] Main number at [phone].AMBULATORY SURGERY  °DISCHARGE INSTRUCTIONS ° ° °5) The drugs that you were given will stay in your system until tomorrow so for the next 24 hours you should not: ° °D) Drive an automobile °E) Make any legal decisions °F) Drink any alcoholic beverage ° ° °6) You may resume regular meals tomorrow.  Today it is better to start with liquids and gradually work up to solid foods. ° °You may eat anything you prefer, but it is better to start with liquids, then soup and crackers, and gradually work up to solid foods. ° ° °7) Please notify your doctor immediately if you have any unusual bleeding, trouble breathing, redness and pain at the surgery site, drainage, fever, or pain not relieved by medication. ° ° ° °8) Additional Instructions: ° ° ° ° ° ° ° °Please contact your physician with any problems or Same Day Surgery at [phone], Monday through Friday 6 am to 4 pm, or  at [facility] Main number at [phone]. °

## 2016-07-13 NOTE — Transfer of Care (Signed)
Immediate Anesthesia Transfer of Care Note  Patient: Perry Morris  Procedure(s) Performed: Procedure(s): OPEN REDUCTION INTERNAL FIXATION (ORIF) ANKLE FRACTURE (Left)  Patient Location: PACU  Anesthesia Type:General  Level of Consciousness: sedated  Airway & Oxygen Therapy: Patient Spontanous Breathing and Patient connected to face mask oxygen  Post-op Assessment: Report given to RN and Post -op Vital signs reviewed and stable  Post vital signs: Reviewed and stable  Last Vitals:  Vitals:   07/13/16 0940 07/13/16 1349  BP: 123/61 114/73  Pulse: 66 68  Resp: 16   Temp: 36.7 C 36.6 C    Last Pain:  Vitals:   07/13/16 1349  TempSrc: Tympanic  PainSc:          Complications: No apparent anesthesia complications

## 2016-07-13 NOTE — Anesthesia Postprocedure Evaluation (Signed)
Anesthesia Post Note  Patient: Perry Morris  Procedure(s) Performed: Procedure(s) (LRB): OPEN REDUCTION INTERNAL FIXATION (ORIF) ANKLE FRACTURE (Left)  Patient location during evaluation: PACU Anesthesia Type: General Level of consciousness: awake and alert Pain management: pain level controlled Vital Signs Assessment: post-procedure vital signs reviewed and stable Respiratory status: spontaneous breathing and respiratory function stable Cardiovascular status: stable Anesthetic complications: no    Last Vitals:  Vitals:   07/13/16 1410 07/13/16 1415  BP:    Pulse: 67 65  Resp: 13 14  Temp:      Last Pain:  Vitals:   07/13/16 1415  TempSrc:   PainSc: 4                  Eljay Lave K

## 2016-07-13 NOTE — Anesthesia Preprocedure Evaluation (Signed)
Anesthesia Evaluation  Patient identified by MRN, date of birth, ID band Patient awake    Reviewed: Allergy & Precautions, NPO status , Patient's Chart, lab work & pertinent test results  History of Anesthesia Complications Negative for: history of anesthetic complications  Airway Mallampati: II       Dental   Pulmonary neg pulmonary ROS,           Cardiovascular negative cardio ROS       Neuro/Psych negative neurological ROS     GI/Hepatic negative GI ROS, Neg liver ROS,   Endo/Other  negative endocrine ROS  Renal/GU negative Renal ROS     Musculoskeletal   Abdominal   Peds  Hematology   Anesthesia Other Findings   Reproductive/Obstetrics                             Anesthesia Physical Anesthesia Plan  ASA: I  Anesthesia Plan: Spinal   Post-op Pain Management:    Induction:   Airway Management Planned:   Additional Equipment:   Intra-op Plan:   Post-operative Plan:   Informed Consent: I have reviewed the patients History and Physical, chart, labs and discussed the procedure including the risks, benefits and alternatives for the proposed anesthesia with the patient or authorized representative who has indicated his/her understanding and acceptance.     Plan Discussed with:   Anesthesia Plan Comments:         Anesthesia Quick Evaluation

## 2016-07-19 ENCOUNTER — Ambulatory Visit (INDEPENDENT_AMBULATORY_CARE_PROVIDER_SITE_OTHER): Payer: BLUE CROSS/BLUE SHIELD | Admitting: Family Medicine

## 2016-07-19 ENCOUNTER — Telehealth: Payer: Self-pay | Admitting: Internal Medicine

## 2016-07-19 ENCOUNTER — Encounter: Payer: Self-pay | Admitting: Family Medicine

## 2016-07-19 VITALS — BP 120/74 | HR 70 | Temp 98.7°F | Wt 221.2 lb

## 2016-07-19 DIAGNOSIS — H8110 Benign paroxysmal vertigo, unspecified ear: Secondary | ICD-10-CM | POA: Diagnosis not present

## 2016-07-19 MED ORDER — MECLIZINE HCL 25 MG PO TABS: 25.0000 mg | ORAL_TABLET | Freq: Three times a day (TID) | ORAL | 0 refills | Status: DC | PRN

## 2016-07-19 NOTE — Progress Notes (Signed)
Pre visit review using our clinic review tool, if applicable. No additional management support is needed unless otherwise documented below in the visit note. 

## 2016-07-19 NOTE — Assessment & Plan Note (Signed)
New acute problem. History consistent with BPPV. Treating with meclizine and sending to vestibular rehab.

## 2016-07-19 NOTE — Progress Notes (Signed)
Subjective:  Patient ID: Perry Morris, male    DOB: 03-29-1986  Age: 30 y.o. MRN: [id number]  CC: Dizziness  HPI:  30 year old male presents with complaints of dizziness.  Patient states that he developed dizziness on Sunday. Started suddenly. Patient describes it as "spinning". Worse with movement. Relieved by rest/lack of movement. He reports some associated nausea. No vomiting. Lasts seconds and then resolve. Patient was concerned that this was related to pain medication that he was recently put on following an ankle surgery. However, he has discontinued the medication and still has symptoms. No other complaints at this time.  Social Hx   Social History   Social History  . Marital status: Married    Spouse name: N/A  . Number of children: N/A  . Years of education: N/A   Social History Main Topics  . Smoking status: Never Smoker  . Smokeless tobacco: Never Used  . Alcohol use No  . Drug use: No  . Sexual activity: Not Asked   Other Topics Concern  . None   Social History Narrative  . None    Review of Systems  Gastrointestinal: Positive for nausea.  Neurological: Positive for dizziness.   Objective:  BP 120/74 (BP Location: Left Arm, Patient Position: Sitting, Cuff Size: Normal)   Pulse 70   Temp 98.7 F (37.1 C) (Oral)   Wt 221 lb 4 oz (100.4 kg)   SpO2 99%   BMI 33.64 kg/m   BP/Weight 07/19/2016 07/13/2016 07/11/2016  Systolic BP 120 130 -  Diastolic BP 74 60 -  Wt. (Lbs) 221.25 215 215  BMI 33.64 32.69 32.69   Physical Exam  Constitutional: He is oriented to person, place, and time. He appears well-developed. No distress.  HENT:  Head: Normocephalic and atraumatic.  Eyes: EOM are normal. Pupils are equal, round, and reactive to light.  Cardiovascular: Normal rate and regular rhythm.   Pulmonary/Chest: Effort normal. He has no wheezes. He has no rales.  Neurological: He is alert and oriented to person, place, and time.  No focal deficits.    Vitals reviewed.   Lab Results  Component Value Date   WBC 9.2 07/12/2016   HGB 16.0 07/12/2016   HCT 45.4 07/12/2016   PLT 251 07/12/2016   GLUCOSE 93 07/12/2016   CHOL 151 03/17/2013   TRIG 71.0 03/17/2013   HDL 46.20 03/17/2013   LDLCALC 91 03/17/2013   ALT 32 03/17/2013   AST 24 03/17/2013   NA 138 07/12/2016   K 4.0 07/12/2016   CL 104 07/12/2016   CREATININE 1.15 07/12/2016   BUN 22 (H) 07/12/2016   CO2 29 07/12/2016   TSH 1.37 03/17/2013   INR 0.91 07/12/2016    Assessment & Plan:   Problem List Items Addressed This Visit    BPPV (benign paroxysmal positional vertigo) - Primary    New acute problem. History consistent with BPPV. Treating with meclizine and sending to vestibular rehab.      Relevant Medications   meclizine (ANTIVERT) 25 MG tablet   Other Relevant Orders   Ambulatory referral to Physical Therapy    Other Visit Diagnoses   None.     Meds ordered this encounter  Medications  . acetaminophen (TYLENOL) 500 MG tablet    Sig: Take 500 mg by mouth 2 (two) times daily as needed.  Marland Kitchen ibuprofen (ADVIL,MOTRIN) 800 MG tablet    Sig: Take 800 mg by mouth daily as needed.  Marland Kitchen DOCUSATE SODIUM PO  Sig: Take 1 tablet by mouth 2 (two) times daily.  . meclizine (ANTIVERT) 25 MG tablet    Sig: Take 1 tablet (25 mg total) by mouth 3 (three) times daily as needed for dizziness.    Dispense:  60 tablet    Refill:  0    Follow-up: No Follow-up on file.  Everlene OtherJayce Pasha Gadison DO [facility] [place]

## 2016-07-19 NOTE — Patient Instructions (Signed)
Benign Positional Vertigo Vertigo is the feeling that you or your surroundings are moving when they are not. Benign positional vertigo is the most common form of vertigo. The cause of this condition is not serious (is benign). This condition is triggered by certain movements and positions (is positional). This condition can be dangerous if it occurs while you are doing something that could endanger you or others, such as driving.  CAUSES In many cases, the cause of this condition is not known. It may be caused by a disturbance in an area of the inner ear that helps your brain to sense movement and balance. This disturbance can be caused by a viral infection (labyrinthitis), head injury, or repetitive motion. RISK FACTORS This condition is more likely to develop in:  Women.  People who are 50 years of age or older. SYMPTOMS Symptoms of this condition usually happen when you move your head or your eyes in different directions. Symptoms may start suddenly, and they usually last for less than a minute. Symptoms may include:  Loss of balance and falling.  Feeling like you are spinning or moving.  Feeling like your surroundings are spinning or moving.  Nausea and vomiting.  Blurred vision.  Dizziness.  Involuntary eye movement (nystagmus). Symptoms can be mild and cause only slight annoyance, or they can be severe and interfere with daily life. Episodes of benign positional vertigo may return (recur) over time, and they may be triggered by certain movements. Symptoms may improve over time. DIAGNOSIS This condition is usually diagnosed by medical history and a physical exam of the head, neck, and ears. You may be referred to a health care provider who specializes in ear, nose, and throat (ENT) problems (otolaryngologist) or a provider who specializes in disorders of the nervous system (neurologist). You may have additional testing, including:  MRI.  A CT scan.  Eye movement tests. Your  health care provider may ask you to change positions quickly while he or she watches you for symptoms of benign positional vertigo, such as nystagmus. Eye movement may be tested with an electronystagmogram (ENG), caloric stimulation, the Dix-Hallpike test, or the roll test.  An electroencephalogram (EEG). This records electrical activity in your brain.  Hearing tests. TREATMENT Usually, your health care provider will treat this by moving your head in specific positions to adjust your inner ear back to normal. Surgery may be needed in severe cases, but this is rare. In some cases, benign positional vertigo may resolve on its own in 2-4 weeks. HOME CARE INSTRUCTIONS Safety  Move slowly.Avoid sudden body or head movements.  Avoid driving.  Avoid operating heavy machinery.  Avoid doing any tasks that would be dangerous to you or others if a vertigo episode would occur.  If you have trouble walking or keeping your balance, try using a cane for stability. If you feel dizzy or unstable, sit down right away.  Return to your normal activities as told by your health care provider. Ask your health care provider what activities are safe for you. General Instructions  Take over-the-counter and prescription medicines only as told by your health care provider.  Avoid certain positions or movements as told by your health care provider.  Drink enough fluid to keep your urine clear or pale yellow.  Keep all follow-up visits as told by your health care provider. This is important. SEEK MEDICAL CARE IF:  You have a fever.  Your condition gets worse or you develop new symptoms.  Your family or friends   notice any behavioral changes.  Your nausea or vomiting gets worse.  You have numbness or a "pins and needles" sensation. SEEK IMMEDIATE MEDICAL CARE IF:  You have difficulty speaking or moving.  You are always dizzy.  You faint.  You develop severe headaches.  You have weakness in your  legs or arms.  You have changes in your hearing or vision.  You develop a stiff neck.  You develop sensitivity to light.   This information is not intended to replace advice given to you by your health care provider. Make sure you discuss any questions you have with your health care provider.   Document Released: 08/07/2006 Document Revised: 07/21/2015 Document Reviewed: 02/22/2015 Elsevier Interactive Patient Education 2016 Elsevier Inc.  

## 2016-07-19 NOTE — Telephone Encounter (Signed)
Patient Name: Perry ShanBEN Morris  DOB: 03/09/1986    Initial Comment Caller states he's dizzy. Balance issues. He had surgery on his ankle last week.    Nurse Assessment  Nurse: Deatra JamesNoe, RN, Corrie DandyMary Date/Time Perry Morris(Eastern Time): 07/19/2016 11:46:31 AM  Confirm and document reason for call. If symptomatic, describe symptoms. You must click the next button to save text entered. ---Patient states he had surgery last Thursday and he is having dizziness and everything is spinning. He is not taking pain medicine.  Has the patient traveled out of the country within the last 30 days? ---No  Does the patient have any new or worsening symptoms? ---Yes  Will a triage be completed? ---Yes  Related visit to physician within the last 2 weeks? ---No  Does the PT have any chronic conditions? (i.e. diabetes, asthma, etc.) ---No  Is this a behavioral health or substance abuse call? ---No     Guidelines    Guideline Title Affirmed Question Affirmed Notes  Dizziness - Vertigo [1] MODERATE dizziness (e.g., vertigo; feels very unsteady, interferes with normal activities) AND [2] has NOT been evaluated by physician for this    Final Disposition User   See Physician within 24 Hours Noe, RN, [facility]    Referrals  REFERRED TO PCP OFFICE   Disagree/Comply: Comply     Appt made with Dr. Adriana Simasook today at [facility]3pm

## 2016-07-19 NOTE — Telephone Encounter (Signed)
FYI

## 2016-07-19 NOTE — Telephone Encounter (Signed)
Seeing Dr. Adriana Simasook today.

## 2016-07-26 ENCOUNTER — Ambulatory Visit: Payer: BLUE CROSS/BLUE SHIELD | Attending: Family Medicine

## 2016-07-26 VITALS — BP 122/74 | HR 79

## 2016-07-26 DIAGNOSIS — R42 Dizziness and giddiness: Secondary | ICD-10-CM | POA: Diagnosis present

## 2016-07-26 DIAGNOSIS — H8111 Benign paroxysmal vertigo, right ear: Secondary | ICD-10-CM | POA: Insufficient documentation

## 2016-07-26 NOTE — Patient Instructions (Signed)
Epley Maneuver Self-Care  WHAT IS THE EPLEY MANEUVER?  The Epley maneuver is an exercise you can do to relieve symptoms of benign paroxysmal positional vertigo (BPPV). This condition is often just referred to as vertigo. BPPV is caused by the movement of tiny crystals (canaliths) inside your inner ear. The accumulation and movement of canaliths in your inner ear causes a sudden spinning sensation (vertigo) when you move your head to certain positions. Vertigo usually lasts about 30 seconds. BPPV usually occurs in just one ear. If you get vertigo when you lie on your left side, you probably have BPPV in your left ear. Your health care provider can tell you which ear is involved.   BPPV may be caused by a head injury. Many people older than 50 get BPPV for unknown reasons. If you have been diagnosed with BPPV, your health care provider may teach you how to do this maneuver. BPPV is not life threatening (benign) and usually goes away in time.   WHEN SHOULD I PERFORM THE EPLEY MANEUVER?  You can do this maneuver at home whenever you have symptoms of vertigo. You may do the Epley maneuver up to 3 times a day until your symptoms of vertigo go away.  HOW SHOULD I DO THE EPLEY MANEUVER?  1. Sit on the edge of a bed or table with your back straight. Your legs should be extended or hanging over the edge of the bed or table.    2. Turn your head halfway toward the affected ear.    3. Lie backward quickly with your head turned until you are lying flat on your back. You may want to position a pillow under your shoulders.    4. Hold this position for 30 seconds. You may experience an attack of vertigo. This is normal. Hold this position until the vertigo stops.  5. Then turn your head to the opposite direction until your unaffected ear is facing the floor.    6. Hold this position for 30 seconds. You may experience an attack of vertigo. This is normal. Hold this position until the vertigo stops.  7. Now turn your whole body to  the same side as your head. Hold for another 30 seconds.    8. You can then sit back up.  ARE THERE RISKS TO THIS MANEUVER?  In some cases, you may have other symptoms (such as changes in your vision, weakness, or numbness). If you have these symptoms, stop doing the maneuver and call your health care provider. Even if doing these maneuvers relieves your vertigo, you may still have dizziness. Dizziness is the sensation of light-headedness but without the sensation of movement. Even though the Epley maneuver may relieve your vertigo, it is possible that your symptoms will return within 5 years.  WHAT SHOULD I DO AFTER THIS MANEUVER?  After doing the Epley maneuver, you can return to your normal activities. Ask your doctor if there is anything you should do at home to prevent vertigo. This may include:  · Sleeping with two or more pillows to keep your head elevated.  · Not sleeping on the side of your affected ear.  · Getting up slowly from bed.  · Avoiding sudden movements during the day.  · Avoiding extreme head movement, like looking up or bending over.  · Wearing a cervical collar to prevent sudden head movements.  WHAT SHOULD I DO IF MY SYMPTOMS GET WORSE?  Call your health care provider if your vertigo gets worse. Call your provider right way if   you have other symptoms, including:   · Nausea.  · Vomiting.  · Headache.  · Weakness.  · Numbness.  · Vision changes.     This information is not intended to replace advice given to you by your health care provider. Make sure you discuss any questions you have with your health care provider.     Document Released: 11/04/2013 Document Reviewed: 11/04/2013  Elsevier Interactive Patient Education ©2016 Elsevier Inc.

## 2016-07-26 NOTE — Therapy (Signed)
[facility] [address] [place], [place], [address] Phone: [phone]   Fax:  [phone]  Physical Therapy Evaluation  Patient Details  Name: Perry Morris MRN: [id number] Date of Birth: 07/04/1986 Referring Provider: Everlene Otherook, Jayce  Encounter Date: 07/26/2016      PT End of Session - 07/26/16 1213    Visit Number 1   Number of Visits 5   Date for PT Re-Evaluation 08/23/16   Authorization Type no g codes   PT Start Time 0815   PT Stop Time 0915   PT Time Calculation (min) 60 min   Activity Tolerance Patient tolerated treatment well   Behavior During Therapy [facility] for tasks assessed/performed      Past Medical History:  Diagnosis Date  . GERD (gastroesophageal reflux disease)    rare-no meds  . Headache    h/o migraines    Past Surgical History:  Procedure Laterality Date  . ORIF ANKLE FRACTURE Left 07/13/2016   Procedure: OPEN REDUCTION INTERNAL FIXATION (ORIF) ANKLE FRACTURE;  Surgeon: Juanell FairlyKevin Krasinski, MD;  Location: [facility] ORS;  Service: Orthopedics;  Laterality: Left;  . WISDOM TOOTH EXTRACTION      Vitals:   07/26/16 0819  BP: 122/74  Pulse: 79  SpO2: 100%         Subjective Assessment - 07/26/16 1210    Subjective Vertigo   Pertinent History 30 year old male presents with complaints of vertigo when laying down. Patient states that he developed dizziness on Sunday after his L ankle surgery on Thursday 07/13/16. He underwent L ankle ORIF for fibula fracture with syndesmotic sprain/separation. That Sunday night his symptoms started suddenly when he stood up in the middle of the night. Patient describes it as "spinning" sensation. Currently it only occurs when he lays back. He states that it is bad when he turns his head to the left but worse when he rolls his head back to midline. Symptoms last for approximately 3 seconds. Relieved by rest/lack of movement. He reports some associated nausea. No vomiting.  Pt was concerned initially that it may be related to the attempted spinal block during surgery or maybe due to the pain medication. No other complaints at this time. ROS negative for red flags.   Currently in Pain? No/denies       VESTIBULAR AND BALANCE EVALUATION    HISTORY:  Subjective history of current problem: 30 year old male presents with complaints of vertigo when laying down. Patient states that he developed dizziness on Sunday after his L ankle surgery on Thursday 07/13/16. He underwent L ankle ORIF for fibula fracture with syndesmotic sprain/separation. That Sunday night his symptoms started suddenly when he stood up in the middle of the night. Patient describes it as "spinning" sensation. Currently it only occurs when he lays back. He states that it is bad when he turns his head to the left but worse when he rolls his head back to midline. Symptoms last for approximately 3 seconds. Relieved by rest/lack of movement. He reports some associated nausea. No vomiting. Pt was concerned initially that it may be related to the attempted spinal block during surgery or maybe due to the pain medication. No other complaints at this time. ROS negative for red flags.   Description of dizziness: vertigo, denies lightheadedness or syncope Frequency: Multiple times/day, whenever he lays down.  Duration: Seconds Symptom nature: (motion provoked/positional/spontaneous/constant, variable, intermittent): motion provoked  Provocative Factors: Laying back Easing Factors: Staying still  Progression of symptoms: (better, worse,  no change since onset): Better History of similar episodes: no  Falls (yes/no): No Number of falls in past 6 months: No  Prior Functional Level: Fully functional  Auditory complaints (tinnitus, pain, drainage): Denies Vision (last eye exam, diplopia, recent changes): No  Current Symptoms: Confirms: vertigo, nausea; Denies: N & V, dysarthria, dysphagia, drop attacks,  lightheadedness, rocking, general unsteadiness, imbalance, tilting, swaying, veering. Review of systems negative for red flags.   EXAMINATION  POSTURE:   NEUROLOGICAL SCREEN: (2+ unless otherwise noted.) N=normal  Ab=abnormal  Level Dermatome R L Myotome R L Reflex R L  C3 Anterior Neck N N Sidebend C2-3 N N Jaw CN V    C4 Top of Shoulder N N Shoulder Shrug C4   Hoffman's UMN    C5 Lateral Upper Arm N N Shoulder ABD C4-5   Biceps C5-6    C6 Lateral Arm/ Thumb N N Arm Flex/ Wrist Ext C5-6 N N Brachiorad. C5-6    C7 Middle Finger N N Arm Ext//Wrist Flex C6-7 N N Triceps C7    C8 4th & 5th Finger N N Flex/ Ext Carpi Ulnaris C8   Patella    T1 Medial Arm N N Interossei T1   Gastrocnemius    L2 Medial thigh/groin N N Illiopsoas (L2-3) N N     L3 Lower thigh/med.knee N N Quadriceps (L3-4) N  Patellar (L3-4)    L4 Medial leg/lat thigh N  Tibialis Ant (L4-5) N      L5 Lat. leg & dorsal foot N  EHL (L5) N      S1 post/lat foot/thigh/leg N  Gastrocnemius (S1-2) N  Gastrocnemius (S1)    S2 Post./med. thigh & leg N  Hamstrings (L4-S3) N  Babinski     Reflex testing deferred. L leg in cast so unable to test all myotomes or dermatomes. Pt able to feel light touch to L toes. He is able to flex/ext toes of L foot.   SOMATOSENSORY:         Sensation           Intact      Diminished         Absent  Light touch Intact    Any N & T in extremities or weakness: Subjective mild L lateral foot numbness s/p surgery. Orthopedist aware and reports consistent with incisions from surgery      COORDINATION: Finger to Nose: Normal Past Pointing: Normal Pronator Drift: Normal   MUSCULOSKELETAL SCREEN: Cervical Spine ROM: WNL   ROM: WNL  MMT: WNL  Functional Mobility: independent with transfers. Using knee scooter for mobility secondary to L lateral ankle ORIF, NWB and cast  Gait: Scanning of visual environment with gait is: Excellent. Pt on k  Balance: Deferred, on knee scooter   POSTURAL CONTROL  TESTS:   Clinical Test of Sensory Interaction for Balance (CTSIB): Deferred s/p NWB LLE   OCULOMOTOR / VESTIBULAR TESTING:  Oculomotor Exam- Room Light  Normal Abnormal Comments  Ocular Alignment N    Ocular ROM N    Spontaneous Nystagmus N    End-Gaze Nystagmus N    Smooth Pursuit N    Saccades N    VOR N    VOR Cancellation N    Left Head Thrust N    Right Head Thrust N    Head Shaking Nystagmus N    Static Acuity     Dynamic Acuity       BPPV TESTS:  Symptoms Duration Intensity Nystagmus  L  Dix-Hallpike No   No  R Dix-Hallpike Vertigo 20 seconds with 2 second latency 8/10  Upbeating R torsional  L Head Roll      R Head Roll      L Sidelying Test      R Sidelying Test        TREATMENT  CRT  Pt with positive R Dix-Hallpike consistent with R posterior canal BPPV. Pt taken through 3 rounds of Epley maneuver. First maneuver pt with 20-25 beats upbeating R torsional nystagmus, 8/10 intensity. Pt held in each position for 1 minute. During second bout pt with no nystagmus however 2/10 vertio after 15 second delay which only lasts 1-2 seconds. 3rd bout pt with 2/10 vertigo in second position, L hallpike position, as well as once upright in sitting. Pt provided written handout with education regarding BPPV as well as instructions about how to perform Epley at home. Pt instructed to wait 48 hours and then perform 2 bouts (sequencial) of Epley once/day until he returns for next PT session.                  [facility] PT Assessment - 07/26/16 0001      Assessment   Medical Diagnosis BPPV   Referring Provider Everlene Other   Onset Date/Surgical Date 07/16/16   Next MD Visit Not reported   Prior Therapy None     Precautions   Precautions None     Restrictions   Weight Bearing Restrictions Yes   LLE Weight Bearing Non weight bearing   Other Position/Activity Restrictions Unrelated to current episode     Balance Screen   Has the patient fallen in the past 6 months No    Has the patient had a decrease in activity level because of a fear of falling?  No   Is the patient reluctant to leave their home because of a fear of falling?  No     Home Nurse, mental health Private residence   Living Arrangements Spouse/significant other;Children   Available Help at Discharge Family     Prior Function   Level of Independence Independent   Vocation Full time employment   Vocation Requirements Works in Airline pilot, travels frequently   Leisure Pt likes to do Crossfit prior to his recent LLE fracture     Cognition   Overall Cognitive Status Within Functional Limits for tasks assessed                           PT Education - 07/26/16 1213    Education provided Yes   Education Details Plan of care, BPPV, home instructions for Epley   Person(s) Educated Patient   Methods Explanation   Comprehension Verbalized understanding             PT Long Term Goals - 07/26/16 1218      PT LONG TERM GOAL #1   Title Pt will be independent with home Epley maneuver in order to manage symptoms   Time 4   Period Weeks   Status New     PT LONG TERM GOAL #2   Title Pt will report no further episodes of vertigo and no further nystagmus with Dix-Hallpike testing   Time 4   Period Weeks   Status New               Plan - 07/26/16 1214    Clinical Impression Statement Pt is a pleasant 30 yo male  referred for BPPV. Dix Hallpike positive for R posterior canal BPPV on this date. L Dix-Hallpike test is negative. Additional vestibular testing negative at this time. Pt treated with Epley maneuver for R BPPV with complete resolution of his symptoms during session. Pt issued education about how to perform at Epley home and will follow-up for repeat testing and treatment in 1 week and then additionally until symptoms are completely resolved. Pt will benefit from skilled PT services to address vertigo and return to full function at home and work  without symptoms.    Rehab Potential Excellent   Clinical Impairments Affecting Rehab Potential Positive: motivation, responded to Epley; Negative: none   PT Frequency 1x / week   PT Duration 4 weeks   PT Treatment/Interventions Canalith Repostioning;Aquatic Therapy;Traction;Cryotherapy;Electrical Stimulation;Moist Heat;Ultrasound;DME Instruction;Gait Network engineer;Therapeutic activities;Therapeutic exercise;Balance training;Neuromuscular re-education;Patient/family education;Manual techniques;Vestibular   PT Next Visit Plan Repeat Dix-Hallpike for R posterior canal BPPV, treat with Epley as needed   PT Home Exercise Plan Home Epley manever issued and explained   Consulted and Agree with Plan of Care Patient      Patient will benefit from skilled therapeutic intervention in order to improve the following deficits and impairments:  Dizziness  Visit Diagnosis: BPPV (benign paroxysmal positional vertigo), right - Plan: PT plan of care cert/re-cert  Dizziness and giddiness - Plan: PT plan of care cert/re-cert     Problem List Patient Active Problem List   Diagnosis Date Noted  . BPPV (benign paroxysmal positional vertigo) 07/19/2016  . Obesity, unspecified 02/28/2013   Lynnea Maizes PT, DPT   Yunique Dearcos 07/26/2016, 12:29 PM  [facility] MAIN [facility] SERVICES [address] [place], [place], [address] Phone: [phone]   Fax:  [phone]  Name: Perry Morris MRN: [id number] Date of Birth: 10-Nov-1986

## 2016-07-27 ENCOUNTER — Ambulatory Visit: Payer: BLUE CROSS/BLUE SHIELD

## 2016-08-02 ENCOUNTER — Ambulatory Visit: Payer: BLUE CROSS/BLUE SHIELD

## 2016-08-02 DIAGNOSIS — H8111 Benign paroxysmal vertigo, right ear: Secondary | ICD-10-CM

## 2016-08-02 DIAGNOSIS — R42 Dizziness and giddiness: Secondary | ICD-10-CM

## 2016-08-02 NOTE — Therapy (Signed)
[facility] [address] [place], [place], [address] Phone: [phone]   Fax:  [phone]  Physical Therapy Treatment  Patient Details  Name: Perry CaulBenjamin A Morris MRN: [id number]   Date of Birth: 03/19/1986 Referring Provider: Everlene Otherook, Jayce  Encounter Date: 08/02/2016      PT End of Session - 08/02/16 0803    Visit Number 2   Number of Visits 5   Date for PT Re-Evaluation 08/23/16   Authorization Type no g codes   PT Start Time 0805   PT Stop Time 0845   PT Time Calculation (min) 40 min   Activity Tolerance Patient tolerated treatment well   Behavior During Therapy [facility] for tasks assessed/performed      Past Medical History:  Diagnosis Date  . GERD (gastroesophageal reflux disease)    rare-no meds  . Headache    h/o migraines    Past Surgical History:  Procedure Laterality Date  . ORIF ANKLE FRACTURE Left 07/13/2016   Procedure: OPEN REDUCTION INTERNAL FIXATION (ORIF) ANKLE FRACTURE;  Surgeon: Juanell FairlyKevin Krasinski, MD;  Location: [facility] ORS;  Service: Orthopedics;  Laterality: Left;  . WISDOM TOOTH EXTRACTION      There were no vitals filed for this visit.      Subjective Assessment - 08/02/16 0803    Subjective Pt states that he felt bad the rest of the day following the last session but the next day felt well and then had no issues for the next 2 days. Symptoms recurred over the weekend and pt self-treated at home on Saturday night. He felt well by the next morning and symptoms have not recurred since. Pt states that he feels like symptoms may be starting to come back at this time. No specific questions or concerns.    Pertinent History 30 year old male presents with complaints of vertigo when laying down. Patient states that he developed dizziness on Sunday after his L ankle surgery on Thursday 07/13/16. He underwent L ankle ORIF for fibula fracture with syndesmotic sprain/separation. That Sunday night his symptoms  started suddenly when he stood up in the middle of the night. Patient describes it as "spinning" sensation. Currently it only occurs when he lays back. He states that it is bad when he turns his head to the left but worse when he rolls his head back to midline. Symptoms last for approximately 3 seconds. Relieved by rest/lack of movement. He reports some associated nausea. No vomiting. Pt was concerned initially that it may be related to the attempted spinal block during surgery or maybe due to the pain medication. No other complaints at this time. ROS negative for red flags.   Currently in Pain? No/denies        TREATMENT  CRT  Pt with positive R Dix-Hallpike consistent with R posterior canal BPPV. Pt taken through 3 rounds of Epley maneuver. First maneuver pt with 20-25 beats upbeating R torsional nystagmus, 8/10 intensity lasting approximately 10 seconds. Pt held in each position for 2 minutes. Upon returning to sitting pt with 10/10 vertigo and nystagmus reversal noted. During second bout pt with no nystagmus and no vertigo symptoms until he returns to sitting with 10/10 vertigo and reversal of nystagmus again. 3rd bout is same as second bout with recurrent symptoms and nystagmus upon sitting. Pt instructed to wait 24-48 hours and then perform 1 bout of Epley once/day. Recommend at night since it makes him feel unwell. Did not recommend multiple bouts/day since he is resistant  to performing at home as it makes him feel unwell. Discussed using meclizine as well per physician recommendation for symptom management.                            PT Education - 08/02/16 0803    Education provided Yes   Education Details Plan of care, reviewed home BPPV treatment   Person(s) Educated Patient   Methods Explanation   Comprehension Verbalized understanding             PT Long Term Goals - 07/26/16 1218      PT LONG TERM GOAL #1   Title Pt will be independent with home  Epley maneuver in order to manage symptoms   Time 4   Period Weeks   Status New     PT LONG TERM GOAL #2   Title Pt will report no further episodes of vertigo and no further nystagmus with Dix-Hallpike testing   Time 4   Period Weeks   Status New               Plan - 08/02/16 0803    Clinical Impression Statement Pt with very mild positive Dix Hallpike test on the L today but only reports 1/10 vertigo and barely perceptible vertigo. However he continues to have vigorous nystagmus and 8/10 vertigo with R Dix-Hallpike testing. Pt taken through 3 rounds of Epley manuever for R posterior canal BPPV. Reinforced home Epley for self-management and instructed to perform nightly starting tomorrow until he returns. Will continue to treat R posterior canal BPPV until completely clear. Then will recheck L Dix-Hallpike and treat as necessary. At next visit will consider inverted table, deep head hang, and/or semont maneuver.   Rehab Potential Excellent   Clinical Impairments Affecting Rehab Potential Positive: motivation, responded to Epley; Negative: none   PT Frequency 1x / week   PT Duration 4 weeks   PT Treatment/Interventions Canalith Repostioning;Aquatic Therapy;Traction;Cryotherapy;Electrical Stimulation;Moist Heat;Ultrasound;DME Instruction;Gait Network engineer;Therapeutic activities;Therapeutic exercise;Balance training;Neuromuscular re-education;Patient/family education;Manual techniques;Vestibular   PT Next Visit Plan Repeat Dix-Hallpike for R posterior canal BPPV, treat with Epley and attempted on inverted mat table. Consider deep head hang and/or semont as well. Once R side is clear check L side again and treat if needed.    PT Home Exercise Plan Home Epley manever issued and explained. Instructed to perform for 1 bout every night starting day after appointment until he returns.   Consulted and Agree with Plan of Care Patient      Patient will benefit from skilled therapeutic  intervention in order to improve the following deficits and impairments:  Dizziness  Visit Diagnosis: BPPV (benign paroxysmal positional vertigo), right  Dizziness and giddiness     Problem List Patient Active Problem List   Diagnosis Date Noted  . BPPV (benign paroxysmal positional vertigo) 07/19/2016  . Obesity, unspecified 02/28/2013   Lynnea Maizes PT, DPT   Amandalee Lacap 08/03/2016, 2:22 PM  [facility] MAIN [facility] SERVICES [address] [place], [place], [address] Phone: [phone]   Fax:  [phone]  Name: REON HUNLEY MRN: [id number] Date of Birth: July 26, 1986

## 2016-08-04 ENCOUNTER — Encounter: Payer: BLUE CROSS/BLUE SHIELD | Admitting: Physical Therapy

## 2016-08-08 ENCOUNTER — Encounter: Payer: BLUE CROSS/BLUE SHIELD | Admitting: Physical Therapy

## 2016-08-09 ENCOUNTER — Encounter: Payer: Self-pay | Admitting: Physical Therapy

## 2016-08-09 ENCOUNTER — Ambulatory Visit: Payer: BLUE CROSS/BLUE SHIELD

## 2016-08-09 DIAGNOSIS — H8111 Benign paroxysmal vertigo, right ear: Secondary | ICD-10-CM

## 2016-08-09 DIAGNOSIS — R42 Dizziness and giddiness: Secondary | ICD-10-CM

## 2016-08-09 NOTE — Therapy (Signed)
[facility] MAIN [facility] SERVICES [address] [place], [place], [address] Phone: [phone]   Fax:  [phone]  Physical Therapy Treatment  Patient Details  Name: Perry Morris MRN: [id number] Date of Birth: [birth year] Referring Provider: Everlene Other  Encounter Date: 08/09/2016      PT End of Session - 08/09/16 1558    Visit Number 3   Number of Visits 5   Date for PT Re-Evaluation 08/23/16   Authorization Type no g codes   PT Start Time 1500   PT Stop Time 1520   PT Time Calculation (min) 20 min   Activity Tolerance Patient tolerated treatment well   Behavior During Therapy [facility] for tasks assessed/performed      Past Medical History:  Diagnosis Date  . GERD (gastroesophageal reflux disease)    rare-no meds  . Headache    h/o migraines    Past Surgical History:  Procedure Laterality Date  . ORIF ANKLE FRACTURE Left 07/13/2016   Procedure: OPEN REDUCTION INTERNAL FIXATION (ORIF) ANKLE FRACTURE;  Surgeon: Juanell Fairly, MD;  Location: [facility] ORS;  Service: Orthopedics;  Laterality: Left;  . WISDOM TOOTH EXTRACTION      There were no vitals filed for this visit.      Subjective Assessment - 08/09/16 1538    Subjective Pt reports that he has had no further symptoms since his last therapy session. He performed the Epley at home twice without any recurrence of the symptoms. Pt has no specific questions or concerns at this time.    Pertinent History 30 year old male presents with complaints of vertigo when laying down. Patient states that he developed dizziness on Sunday after his L ankle surgery on Thursday 07/13/16. He underwent L ankle ORIF for fibula fracture with syndesmotic sprain/separation. That Sunday night his symptoms started suddenly when he stood up in the middle of the night. Patient describes it as "spinning" sensation. Currently it only occurs when he lays back. He states that it is bad when he turns his head to the  left but worse when he rolls his head back to midline. Symptoms last for approximately 3 seconds. Relieved by rest/lack of movement. He reports some associated nausea. No vomiting. Pt was concerned initially that it may be related to the attempted spinal block during surgery or maybe due to the pain medication. No other complaints at this time. ROS negative for red flags.   Currently in Pain? No/denies         TREATMENT  CRT  Pt with negative R Dix-Hallpike test bilaterally on this date. Pt taken through 1 rounds of Epley maneuver for R posterior canal BPPV which is what he has been treated for during previous session. Pt held in each position for 2 minutes. Pt does not have any symptoms or nystagmus in any position including when he returns to upright sitting at edge of mat table. Reviewed home Epley maneuver and discussed what to do if symptoms recur. Goals updated. Pt will be discharged on this date having full resolution of his symptoms.                       PT Education - 08/09/16 1558    Education provided Yes   Education Details discharge instruction, how to manage at home and criteria for calling to reschedule   Person(s) Educated Patient   Methods Explanation   Comprehension Verbalized understanding  PT Long Term Goals - 08/09/16 1600      PT LONG TERM GOAL #1   Title Pt will be independent with home Epley maneuver in order to manage symptoms   Time 4   Period Weeks   Status Achieved     PT LONG TERM GOAL #2   Title Pt will report no further episodes of vertigo and no further nystagmus with Dix-Hallpike testing   Time 4   Period Weeks   Status Achieved               Plan - 08/09/16 1558    Clinical Impression Statement Pt with negative Dix-Hallpike test bilaterally today. He does not have any nystagmus or vertigo with R side testing. Pt taken through one full round of Epley maneuver to ensure no recurrence of symptoms in  any positions. Pt remains negative throughout Epley maneuver. Reinforced home treatment and discussed what to do if symptoms recur. Pt will be discharged on this date having achieved full resolution of his symptoms.    Rehab Potential Excellent   Clinical Impairments Affecting Rehab Potential Positive: motivation, responded to Epley; Negative: none   PT Frequency 1x / week   PT Duration 4 weeks   PT Treatment/Interventions Canalith Repostioning;Aquatic Therapy;Traction;Cryotherapy;Electrical Stimulation;Moist Heat;Ultrasound;DME Instruction;Gait Network engineertraining;Stair training;Therapeutic activities;Therapeutic exercise;Balance training;Neuromuscular re-education;Patient/family education;Manual techniques;Vestibular   PT Next Visit Plan Discharge   PT Home Exercise Plan Home Epley manever issued and explained. Instructed to perform for 1 bout every night starting day after appointment until he returns.   Consulted and Agree with Plan of Care Patient      Patient will benefit from skilled therapeutic intervention in order to improve the following deficits and impairments:  Dizziness  Visit Diagnosis: Dizziness and giddiness  BPPV (benign paroxysmal positional vertigo), right     Problem List Patient Active Problem List   Diagnosis Date Noted  . BPPV (benign paroxysmal positional vertigo) 07/19/2016  . Obesity, unspecified 02/28/2013   Lynnea MaizesJason D Huprich PT, DPT   Huprich,Jason 08/09/2016, 4:04 PM  [place] [facility] [address] [place], [place], [address] Phone: [phone]   Fax:  [phone]  Name: Perry Morris MRN: [id number] Date of Birth: 05/26/1986

## 2016-08-18 ENCOUNTER — Encounter: Payer: BLUE CROSS/BLUE SHIELD | Admitting: Physical Therapy

## 2016-08-22 ENCOUNTER — Encounter: Payer: BLUE CROSS/BLUE SHIELD | Admitting: Physical Therapy

## 2016-08-23 DIAGNOSIS — H8111 Benign paroxysmal vertigo, right ear: Secondary | ICD-10-CM

## 2016-08-23 DIAGNOSIS — R42 Dizziness and giddiness: Secondary | ICD-10-CM

## 2016-08-23 NOTE — Therapy (Signed)
[facility] [address] [place], [place], [address] Phone: [phone]   Fax:  [phone]  August 23, 2016    Physical Therapy Discharge Summary  Patient: Perry CaulBenjamin A Cuccaro  MRN: [id number]  Date of Birth: 07/17/1986   Diagnosis:  Dizziness and giddiness  BPPV (benign paroxysmal positional vertigo), right Referring Provider: Everlene Otherook, Jayce  The above patient had been seen in Physical Therapy 3 times of 3 treatments scheduled with 0 no shows and 0 cancellations.  The treatment consisted of 1 evaluation and 2 follow-up treatments The patient is: Improved  Subjective: Pt was treated for R posterior canal BPPV with complete resolution of his symptoms. He was instructed how to perform the Epley maneuver to treat the R side if symptoms recur. If symptoms recur and remain resistant to self-management pt instructed to obtain new order to return for vestibular therapy.   Discharge Findings: Complete resolution of symptoms  Functional Status at Discharge: Fully independent      PT Long Term Goals - 08/09/16 1600      PT LONG TERM GOAL #1   Title Pt will be independent with home Epley maneuver in order to manage symptoms   Time 4   Period Weeks   Status Achieved     PT LONG TERM GOAL #2   Title Pt will report no further episodes of vertigo and no further nystagmus with Dix-Hallpike testing   Time 4   Period Weeks   Status Achieved         Sincerely,   Huprich,Jason, PT   Lynnea MaizesJason D Huprich PT, DPT 08/23/16, 8:19 AM [id number]   [facility] [address] [place], [place], [address] Phone: [phone]   Fax:  [phone]  Patient: Perry CaulBenjamin A Chesbro  MRN: [id number]  Date of Birth: 07/27/1986

## 2016-09-01 ENCOUNTER — Encounter: Payer: BLUE CROSS/BLUE SHIELD | Admitting: Physical Therapy

## 2016-09-08 ENCOUNTER — Encounter: Payer: BLUE CROSS/BLUE SHIELD | Admitting: Physical Therapy

## 2016-09-12 ENCOUNTER — Encounter: Payer: BLUE CROSS/BLUE SHIELD | Admitting: Physical Therapy

## 2016-09-19 ENCOUNTER — Encounter: Payer: BLUE CROSS/BLUE SHIELD | Admitting: Physical Therapy

## 2017-06-01 ENCOUNTER — Ambulatory Visit (INDEPENDENT_AMBULATORY_CARE_PROVIDER_SITE_OTHER): Payer: BLUE CROSS/BLUE SHIELD | Admitting: Family Medicine

## 2017-06-01 ENCOUNTER — Encounter: Payer: Self-pay | Admitting: Family Medicine

## 2017-06-01 VITALS — BP 108/80 | HR 72 | Temp 99.0°F | Wt 232.4 lb

## 2017-06-01 DIAGNOSIS — M2550 Pain in unspecified joint: Secondary | ICD-10-CM | POA: Diagnosis not present

## 2017-06-01 LAB — COMPREHENSIVE METABOLIC PANEL
ALBUMIN: 4.6 g/dL (ref 3.5–5.2)
ALK PHOS: 79 U/L (ref 39–117)
ALT: 28 U/L (ref 0–53)
AST: 29 U/L (ref 0–37)
BUN: 15 mg/dL (ref 6–23)
CHLORIDE: 101 meq/L (ref 96–112)
CO2: 26 mEq/L (ref 19–32)
Calcium: 9.5 mg/dL (ref 8.4–10.5)
Creatinine, Ser: 1.39 mg/dL (ref 0.40–1.50)
GFR: 62.63 mL/min (ref >60.00)
Glucose, Bld: 96 mg/dL (ref 70–99)
POTASSIUM: 4 meq/L (ref 3.5–5.1)
Sodium: 135 mEq/L (ref 135–145)
TOTAL PROTEIN: 7.3 g/dL (ref 6.0–8.3)
Total Bilirubin: 0.6 mg/dL (ref 0.2–1.2)

## 2017-06-01 LAB — CK: CK TOTAL: 332 U/L — AB (ref 7–232)

## 2017-06-01 NOTE — Patient Instructions (Signed)
We will call with the result.  Hydrate.  Take care  Dr. Adriana Simasook

## 2017-06-01 NOTE — Progress Notes (Signed)
Subjective:  Patient ID: Perry Morris, male    DOB: May 10, 1986  Age: 31 y.o. MRN: [id number]  CC: Joint pain, fatigue  HPI:  31 year old male presents with the above complaints.  Patient reports that he has severe joint pain. Joints affected: Hand, wrist, back, neck, ankle. Patient states on Wednesday he worked out (running). He suddenly worked out again on Thursday morning. He states that he did a Crossfit work out (muscle up, deadlifts, etc). Since that time he's had severe joint pain in the above-mentioned areas. He reports associated fatigue. He states that he feels out of the norm compared to what he usually has post exercise. He reports that he feels as if he has the flu but without the respiratory symptoms. His wife is a Engineer, civil (consulting) and he is concerned he may have rhabdo. No medications or interventions tried. Worse with activity. No relieving factors. No other associated symptoms. No other complaints at this time.  Social Hx   Social History   Social History  . Marital status: Married    Spouse name: N/A  . Number of children: N/A  . Years of education: N/A   Social History Main Topics  . Smoking status: Never Smoker  . Smokeless tobacco: Never Used  . Alcohol use No  . Drug use: No  . Sexual activity: Not Asked   Other Topics Concern  . None   Social History Narrative  . None    Review of Systems  Constitutional: Positive for fatigue.  Musculoskeletal: Positive for arthralgias.   Objective:  BP 108/80   Pulse 72   Temp 99 F (37.2 C) (Oral)   Wt 232 lb 6.4 oz (105.4 kg)   SpO2 98%   BMI 35.34 kg/m   BP/Weight 06/01/2017 07/26/2016 07/19/2016  Systolic BP 108 122 120  Diastolic BP 80 74 74  Wt. (Lbs) 232.4 - 221.25  BMI 35.34 - 33.64   Physical Exam  Constitutional: He is oriented to person, place, and time. He appears well-developed. No distress.  Cardiovascular: Normal rate and regular rhythm.   Pulmonary/Chest: Effort normal. He has no wheezes. He has  no rales.  Musculoskeletal:  Decreased range of motion of the right hand. Normal range of motion of the wrists and elbows bilaterally. No evidence of synovitis noted on exam.  Neurological: He is alert and oriented to person, place, and time.  Psychiatric: He has a normal mood and affect.  Vitals reviewed.   Lab Results  Component Value Date   WBC 9.2 07/12/2016   HGB 16.0 07/12/2016   HCT 45.4 07/12/2016   PLT 251 07/12/2016   GLUCOSE 93 07/12/2016   CHOL 151 03/17/2013   TRIG 71.0 03/17/2013   HDL 46.20 03/17/2013   LDLCALC 91 03/17/2013   ALT 32 03/17/2013   AST 24 03/17/2013   NA 138 07/12/2016   K 4.0 07/12/2016   CL 104 07/12/2016   CREATININE 1.15 07/12/2016   BUN 22 (H) 07/12/2016   CO2 29 07/12/2016   TSH 1.37 03/17/2013   INR 0.91 07/12/2016    Assessment & Plan:   Problem List Items Addressed This Visit      Other   Arthralgia - Primary    New problem. I feel that this is all post vigorous exercise, but I cannot rule out underlying rhabdo (this should be an atypical presentation as far symptomatology is concerned). Obtaining CMP and CK today. Advised hydration. IF CK is normal will proceed with anti-inflammatory. Will await studies.  Relevant Orders   CK (Creatine Kinase)   Comprehensive metabolic panel     Follow-up: PRN  Everlene OtherJayce Terrina Docter DO [facility] [place]

## 2017-06-01 NOTE — Assessment & Plan Note (Signed)
New problem. I feel that this is all post vigorous exercise, but I cannot rule out underlying rhabdo (this should be an atypical presentation as far symptomatology is concerned). Obtaining CMP and CK today. Advised hydration. IF CK is normal will proceed with anti-inflammatory. Will await studies.

## 2017-06-04 ENCOUNTER — Telehealth: Payer: Self-pay | Admitting: Internal Medicine

## 2017-06-04 NOTE — Telephone Encounter (Signed)
Pt called and had some questions in regards to results. Please advise, thank you!  Call pt @ [phone]

## 2017-06-04 NOTE — Telephone Encounter (Signed)
See labs for documentation

## 2017-08-08 DIAGNOSIS — M5412 Radiculopathy, cervical region: Secondary | ICD-10-CM | POA: Insufficient documentation

## 2017-08-27 DIAGNOSIS — M542 Cervicalgia: Secondary | ICD-10-CM | POA: Insufficient documentation

## 2019-06-03 NOTE — Telephone Encounter (Signed)
Spoke with pt and was able to schedule him an appt for tomorrow. Pt is aware of appt date and time.

## 2019-06-04 ENCOUNTER — Encounter: Payer: Self-pay | Admitting: Internal Medicine

## 2019-06-04 ENCOUNTER — Ambulatory Visit (INDEPENDENT_AMBULATORY_CARE_PROVIDER_SITE_OTHER): Payer: Managed Care, Other (non HMO) | Admitting: Internal Medicine

## 2019-06-04 ENCOUNTER — Other Ambulatory Visit: Payer: Self-pay

## 2019-06-04 DIAGNOSIS — Z13220 Encounter for screening for lipoid disorders: Secondary | ICD-10-CM

## 2019-06-04 DIAGNOSIS — R3911 Hesitancy of micturition: Secondary | ICD-10-CM

## 2019-06-04 DIAGNOSIS — Z113 Encounter for screening for infections with a predominantly sexual mode of transmission: Secondary | ICD-10-CM

## 2019-06-04 DIAGNOSIS — R634 Abnormal weight loss: Secondary | ICD-10-CM

## 2019-06-04 DIAGNOSIS — R351 Nocturia: Secondary | ICD-10-CM

## 2019-06-04 DIAGNOSIS — R350 Frequency of micturition: Secondary | ICD-10-CM | POA: Diagnosis not present

## 2019-06-04 DIAGNOSIS — R3913 Splitting of urinary stream: Secondary | ICD-10-CM

## 2019-06-04 NOTE — Progress Notes (Signed)
Virtual Visit via [email or web address]  This visit type was conducted due to national recommendations for restrictions regarding the COVID-19 pandemic (e.g. social distancing).  This format is felt to be most appropriate for this patient at this time.  All issues noted in this document were discussed and addressed.  No physical exam was performed (except for noted visual exam findings with Video Visits).   I connected with@ on 06/04/19 at 11:30 AM EDT by a video enabled telemedicine application or telephone and verified that I am speaking with the correct person using two identifiers. Location patient: home Location provider: work or home office Persons participating in the virtual visit: patient, provider  I discussed the limitations, risks, security and privacy concerns of performing an evaluation and management service by telephone and the availability of in person appointments. I also discussed with the patient that there may be a patient responsible charge related to this service. The patient expressed understanding and agreed to proceed.  Reason for visit: establish care   HPI:  33 yr old last seen in Sept 2014 presents  To reestablish care .  He has been having nocturia for the past year,  And more frequency  Up to 3 times per night for the last 6 months.  He describes his voids as large volume, He denies hesitancy. He does note that his stream is no longer singular but wide based and splatters the entire commode and floor.  He denies dysuria, renal calculi and hematuria.  Caffeine intake reviewed: none after 2 pm.  His water intake at night is high because he works out at night.  He has had increased thirst and some weight loss.    ROS: See pertinent positives and negatives per HPI.  Past Medical History:  Diagnosis Date  . GERD (gastroesophageal reflux disease)    rare-no meds  . Headache    h/o migraines    Past Surgical History:  Procedure Laterality Date  . ORIF ANKLE FRACTURE Left  07/13/2016   Procedure: OPEN REDUCTION INTERNAL FIXATION (ORIF) ANKLE FRACTURE;  Surgeon: Thornton Park, MD;  Location: [facility] ORS;  Service: Orthopedics;  Laterality: Left;  . WISDOM TOOTH EXTRACTION      Family History  Problem Relation Age of Onset  . Stroke Father 41       thrombotic,  carotid artery   . Cancer Brother 16       lymphoma    SOCIAL HX:  reports that he has never smoked. He has never used smokeless tobacco. He reports that he does not drink alcohol or use drugs.  Social History   Social History Narrative      He has been divorced  due to his wife's drug abuse.  He has been awarded custody of his two children ages 55 and 20.  He was tested for STDS in December after his divorcce finalized.     No current outpatient medications on file.  EXAM:  VITALS per patient if applicable:  GENERAL: alert, oriented, appears well and in no acute distress  HEENT: atraumatic, conjunttiva clear, no obvious abnormalities on inspection of external nose and ears  NECK: normal movements of the head and neck  LUNGS: on inspection no signs of respiratory distress, breathing rate appears normal, no obvious gross SOB, gasping or wheezing  CV: no obvious cyanosis  MS: moves all visible extremities without noticeable abnormality  PSYCH/NEURO: pleasant and cooperative, no obvious depression or anxiety, speech and thought processing grossly intact  ASSESSMENT AND PLAN:  Urinary  stream splitting Etiology unclear sine he has no history of stones .  Referral to Dr Lonna CobbStoioff per request.   Nocturia Negative screen for infection and diabetes. PSA normal  Lab Results  Component Value Date   HGBA1C 5.2 06/05/2019   Lab Results  Component Value Date   PSA 0.97 06/05/2019     Screen for STD (sexually transmitted disease) HIV and syphilis screens are negative   Screening for hyperlipidemia Fasting lipids are normal  Lab Results  Component Value Date   CHOL 143 06/05/2019    HDL 45.50 06/05/2019   LDLCALC 85 06/05/2019   TRIG 64.0 06/05/2019   CHOLHDL 3 06/05/2019       I discussed the assessment and treatment plan with the patient. The patient was provided an opportunity to ask questions and all were answered. The patient agreed with the plan and demonstrated an understanding of the instructions.   The patient was advised to call back or seek an in-person evaluation if the symptoms worsen or if the condition fails to improve as anticipated.  I provided 30 minutes of non-face-to-face time during this encounter.   Sherlene Shamseresa L Derra Shartzer, MD

## 2019-06-05 ENCOUNTER — Other Ambulatory Visit (INDEPENDENT_AMBULATORY_CARE_PROVIDER_SITE_OTHER): Payer: Managed Care, Other (non HMO)

## 2019-06-05 ENCOUNTER — Other Ambulatory Visit: Payer: Self-pay

## 2019-06-05 ENCOUNTER — Other Ambulatory Visit (HOSPITAL_COMMUNITY)
Admission: RE | Admit: 2019-06-05 | Discharge: 2019-06-05 | Disposition: A | Discharge status: Home / Self Care | Payer: Managed Care, Other (non HMO) | Source: Ambulatory Visit | Attending: Internal Medicine | Admitting: Internal Medicine

## 2019-06-05 DIAGNOSIS — R3911 Hesitancy of micturition: Secondary | ICD-10-CM

## 2019-06-05 DIAGNOSIS — R634 Abnormal weight loss: Secondary | ICD-10-CM

## 2019-06-05 DIAGNOSIS — Z113 Encounter for screening for infections with a predominantly sexual mode of transmission: Secondary | ICD-10-CM | POA: Insufficient documentation

## 2019-06-05 DIAGNOSIS — R350 Frequency of micturition: Secondary | ICD-10-CM | POA: Diagnosis not present

## 2019-06-05 LAB — COMPREHENSIVE METABOLIC PANEL
ALT: 26 U/L (ref 0–53)
AST: 20 U/L (ref 0–37)
Albumin: 4.7 g/dL (ref 3.5–5.2)
Alkaline Phosphatase: 53 U/L (ref 39–117)
BUN: 16 mg/dL (ref 6–23)
CO2: 26 mEq/L (ref 19–32)
Calcium: 9.6 mg/dL (ref 8.4–10.5)
Chloride: 103 mEq/L (ref 96–112)
Creatinine, Ser: 1.1 mg/dL (ref 0.40–1.50)
GFR: 76.27 mL/min (ref >60.00)
Glucose, Bld: 81 mg/dL (ref 70–99)
Potassium: 4.2 mEq/L (ref 3.5–5.1)
Sodium: 137 mEq/L (ref 135–145)
Total Bilirubin: 0.8 mg/dL (ref 0.2–1.2)
Total Protein: 6.7 g/dL (ref 6.0–8.3)

## 2019-06-05 LAB — HEMOGLOBIN A1C: Hgb A1c MFr Bld: 5.2 % (ref 4.6–6.5)

## 2019-06-05 LAB — LIPID PANEL
Cholesterol: 143 mg/dL (ref 0–200)
HDL: 45.5 mg/dL (ref >39.00)
LDL Cholesterol: 85 mg/dL (ref 0–99)
NonHDL: 97.43
Total CHOL/HDL Ratio: 3
Triglycerides: 64 mg/dL (ref 0.0–149.0)
VLDL: 12.8 mg/dL (ref 0.0–40.0)

## 2019-06-05 LAB — PSA: PSA: 0.97 ng/mL (ref 0.10–4.00)

## 2019-06-05 NOTE — Addendum Note (Signed)
Addended by: Leeanne Rio on: 06/05/2019 02:45 PM   Modules accepted: Orders

## 2019-06-05 NOTE — Addendum Note (Signed)
Addended by: Leeanne Rio on: 06/05/2019 02:44 PM   Modules accepted: Orders

## 2019-06-06 ENCOUNTER — Encounter: Payer: Self-pay | Admitting: Internal Medicine

## 2019-06-06 DIAGNOSIS — Z113 Encounter for screening for infections with a predominantly sexual mode of transmission: Secondary | ICD-10-CM | POA: Insufficient documentation

## 2019-06-06 DIAGNOSIS — Z13220 Encounter for screening for lipoid disorders: Secondary | ICD-10-CM | POA: Insufficient documentation

## 2019-06-06 DIAGNOSIS — R3913 Splitting of urinary stream: Secondary | ICD-10-CM | POA: Insufficient documentation

## 2019-06-06 DIAGNOSIS — Z0000 Encounter for general adult medical examination without abnormal findings: Secondary | ICD-10-CM | POA: Insufficient documentation

## 2019-06-06 DIAGNOSIS — R351 Nocturia: Secondary | ICD-10-CM | POA: Insufficient documentation

## 2019-06-06 LAB — URINALYSIS, ROUTINE W REFLEX MICROSCOPIC
Bilirubin, UA: NEGATIVE
Glucose, UA: NEGATIVE
Ketones, UA: NEGATIVE
Leukocytes,UA: NEGATIVE
Nitrite, UA: NEGATIVE
Protein,UA: NEGATIVE
RBC, UA: NEGATIVE
Specific Gravity, UA: 1.027 (ref 1.005–1.030)
Urobilinogen, Ur: 0.2 mg/dL (ref 0.2–1.0)
pH, UA: 5 (ref 5.0–7.5)

## 2019-06-06 LAB — HIV ANTIBODY (ROUTINE TESTING W REFLEX): HIV 1&2 Ab, 4th Generation: NONREACTIVE

## 2019-06-06 LAB — RPR: RPR Ser Ql: NONREACTIVE

## 2019-06-06 NOTE — Assessment & Plan Note (Signed)
Etiology unclear sine he has no history of stones .  Referral to Dr Bernardo Heater per request.

## 2019-06-06 NOTE — Assessment & Plan Note (Signed)
Fasting lipids are normal  Lab Results  Component Value Date   CHOL 143 06/05/2019   HDL 45.50 06/05/2019   LDLCALC 85 06/05/2019   TRIG 64.0 06/05/2019   CHOLHDL 3 06/05/2019

## 2019-06-06 NOTE — Assessment & Plan Note (Addendum)
Negative screen for infection and diabetes. PSA normal  Lab Results  Component Value Date   HGBA1C 5.2 06/05/2019   Lab Results  Component Value Date   PSA 0.97 06/05/2019

## 2019-06-06 NOTE — Assessment & Plan Note (Signed)
HIV and syphilis screens are negative

## 2019-06-07 LAB — URINE CYTOLOGY ANCILLARY ONLY
Chlamydia: NEGATIVE
Neisseria Gonorrhea: NEGATIVE

## 2019-06-07 LAB — URINE CULTURE

## 2019-07-17 ENCOUNTER — Ambulatory Visit (INDEPENDENT_AMBULATORY_CARE_PROVIDER_SITE_OTHER): Payer: Managed Care, Other (non HMO) | Admitting: Urology

## 2019-07-17 ENCOUNTER — Other Ambulatory Visit: Payer: Self-pay

## 2019-07-17 ENCOUNTER — Encounter: Payer: Self-pay | Admitting: Urology

## 2019-07-17 VITALS — BP 127/77 | HR 76 | Ht 67.0 in | Wt 235.0 lb

## 2019-07-17 DIAGNOSIS — Z3009 Encounter for other general counseling and advice on contraception: Secondary | ICD-10-CM | POA: Diagnosis not present

## 2019-07-17 DIAGNOSIS — R351 Nocturia: Secondary | ICD-10-CM

## 2019-07-17 DIAGNOSIS — R350 Frequency of micturition: Secondary | ICD-10-CM

## 2019-07-17 DIAGNOSIS — R3913 Splitting of urinary stream: Secondary | ICD-10-CM | POA: Diagnosis not present

## 2019-07-17 MED ORDER — TAMSULOSIN HCL 0.4 MG PO CAPS: 0.4000 mg | ORAL_CAPSULE | Freq: Every day | ORAL | 1 refills | Status: DC

## 2019-07-18 ENCOUNTER — Encounter: Payer: Self-pay | Admitting: Urology

## 2019-07-18 LAB — URINALYSIS, COMPLETE
Bilirubin, UA: NEGATIVE
Glucose, UA: NEGATIVE
Ketones, UA: NEGATIVE
Nitrite, UA: NEGATIVE
Protein,UA: NEGATIVE
RBC, UA: NEGATIVE
Specific Gravity, UA: >1.03 — ABNORMAL HIGH (ref 1.005–1.030)
Urobilinogen, Ur: 0.2 mg/dL (ref 0.2–1.0)
pH, UA: 5 (ref 5.0–7.5)

## 2019-07-18 LAB — MICROSCOPIC EXAMINATION
Bacteria, UA: NONE SEEN
Epithelial Cells (non renal): NONE SEEN /hpf (ref 0–10)
RBC, Urine: NONE SEEN /hpf (ref 0–2)

## 2019-07-18 NOTE — Progress Notes (Signed)
07/17/2019 10:27 AM   Wille Celeste 1986-06-25 [id number]  Referring provider: Crecencio Mc, MD [place],  [place] [address]  Chief Complaint  Patient presents with  . Urinary Frequency    HPI: Perry Morris is a 33 y.o. male seen for evaluation of lower urinary tract symptoms.  He has also requested a vasectomy counseling.  He presents with a six-month history of intermittent symptoms of splitting/spraying of his urinary stream. Denies urinary hesitancy.  He does have frequency and nocturia x2-4 but does stay hydrated.  Denies dysuria or gross hematuria.  No previous history of urologic problems.  He is divorced with 2 children which he has full custody and does not desire additional children.  Denies chronic scrotal pain.   PMH: Past Medical History:  Diagnosis Date  . GERD (gastroesophageal reflux disease)    rare-no meds  . Headache    h/o migraines    Surgical History: Past Surgical History:  Procedure Laterality Date  . ORIF ANKLE FRACTURE Left 07/13/2016   Procedure: OPEN REDUCTION INTERNAL FIXATION (ORIF) ANKLE FRACTURE;  Surgeon: Thornton Park, MD;  Location: [facility] ORS;  Service: Orthopedics;  Laterality: Left;  . WISDOM TOOTH EXTRACTION      Home Medications:  None  Allergies: No Known Allergies  Family History: Family History  Problem Relation Age of Onset  . Stroke Father 46       thrombotic,  carotid artery   . Cancer Brother 57       lymphoma    Social History:  reports that he has never smoked. He has never used smokeless tobacco. He reports that he does not drink alcohol or use drugs.  ROS: UROLOGY Frequent Urination?: Yes Hard to postpone urination?: No Burning/pain with urination?: No Get up at night to urinate?: Yes Leakage of urine?: No Urine stream starts and stops?: No Trouble starting stream?: No Do you have to strain to urinate?: No Blood in urine?: No Urinary tract infection?: No Sexually  transmitted disease?: No Injury to kidneys or bladder?: No Painful intercourse?: No Weak stream?: No Erection problems?: No Penile pain?: No  Gastrointestinal Nausea?: No Vomiting?: No Indigestion/heartburn?: No Diarrhea?: No Constipation?: No  Constitutional Fever: No Night sweats?: No Weight loss?: No Fatigue?: No  Skin Skin rash/lesions?: No Itching?: No  Eyes Blurred vision?: No Double vision?: No  Ears/Nose/Throat Sore throat?: No Sinus problems?: No  Hematologic/Lymphatic Swollen glands?: No Easy bruising?: No  Cardiovascular Leg swelling?: No Chest pain?: No  Respiratory Cough?: No Shortness of breath?: No  Endocrine Excessive thirst?: No  Musculoskeletal Back pain?: No Joint pain?: No  Neurological Headaches?: No Dizziness?: No  Psychologic Depression?: No Anxiety?: No  Physical Exam: BP 127/77   Pulse 76   Ht 5\' 7"  (1.702 m)   Wt 235 lb (106.6 kg)   BMI 36.81 kg/m   Constitutional:  Alert and oriented, No acute distress. HEENT: [place] AT, moist mucus membranes.  Trachea midline, no masses. Cardiovascular: No clubbing, cyanosis, or edema. Respiratory: Normal respiratory effort, no increased work of breathing. GU: Phallus without lesions, meatus normal.  Testes descended bilaterally without masses or tenderness.  Epididymis/spermatic cord palpably normal bilaterally.  Vasa palpable bilaterally. Skin: No rashes, bruises or suspicious lesions. Neurologic: Grossly intact, no focal deficits, moving all 4 extremities. Psychiatric: Normal mood and affect.   Assessment & Plan:    - Splitting/sprain urinary stream His symptoms are intermittent arguing against a fixed obstruction such as a urethral stricture.  Most  likely secondary to include recent bladder neck tone.  We discussed potential dietary triggers.  Trial tamsulosin 0.4 mg daily  - Urinary frequency/nocturia Most likely secondary to fluid intake  - Undesired fertility We had a  long discussion about vasectomy. We specifically discussed the procedure, recovery and the risks, benefits and alternatives of vasectomy. I explained that the procedure entails removal of a segment of each vas deferens, each of which conducts sperm, and that the purpose of this procedure is to cause sterility (inability to produce children or cause pregnancy). Vasectomy is intended to be permanent and irreversible form of contraception. Options for fertility after vasectomy include vasectomy reversal, or sperm retrieval with in vitro fertilization. These options are not always successful, and they may be expensive. We discussed reversible forms of birth control such as condoms, IUD or diaphragms, as well as the option of freezing sperm in a sperm bank prior to the vasectomy procedure. We discussed the importance of avoiding strenuous exercise for four days after vasectomy, and the importance of refraining from any form of ejaculation for seven days after vasectomy. I explained that vasectomy does not produce immediate sterility so another form of contraceptive must be used until sterility is assured by having semen checked for sperm. Thus, a post vasectomy semen analysis is necessary to confirm sterility. Rarely, vasectomy must be repeated. We discussed the approximately 1 in 2,000 risk of pregnancy after vasectomy for men who have post-vasectomy semen analysis showing absent sperm or rare non-motile sperm. Typical side effects include a small amount of oozing blood, some discomfort and mild swelling in the area of incision.  Vasectomy does not affect sexual performance, function, please, sensation, interest, desire, satisfaction, penile erection, volume of semen or ejaculation. Other rare risks include allergy or adverse reaction to an anesthetic, testicular atrophy, hematoma, infection/abscess, prolonged tenderness of the vas deferens, pain, swelling, painful nodule or scar (called sperm granuloma) or epididymtis.  We discussed chronic testicular pain syndrome. This has been reported to occur in as many as 1-2% of men and may be permanent. This can be treated with medication, small procedures or (rarely) surgery.    Riki AltesScott C Breeann Reposa, MD  [facility] [address], [address] [place], [place] [address] [phone]

## 2019-07-22 ENCOUNTER — Telehealth: Payer: Self-pay | Admitting: Family Medicine

## 2019-07-22 NOTE — Telephone Encounter (Signed)
-----   Message from Abbie Sons, MD sent at 07/22/2019  7:33 AM EDT ----- Urinalysis did have a small amount inflammatory cells present.  Urinalysis was normal and Dr. Lupita Dawn office.  Recommend recheck approximately 4-6 weeks.

## 2019-07-22 NOTE — Telephone Encounter (Signed)
Patient notified and voiced understanding.

## 2019-08-27 ENCOUNTER — Encounter: Payer: Managed Care, Other (non HMO) | Admitting: Urology

## 2019-09-01 ENCOUNTER — Telehealth: Payer: Self-pay | Admitting: Urology

## 2019-09-01 NOTE — Telephone Encounter (Signed)
Pt called and asked when would his Valium be called in for his Vasectomy on 09/03/2019.

## 2019-09-01 NOTE — Telephone Encounter (Signed)
Pt just called back and cancelled his Vasectomy, Just FYI.

## 2019-09-03 ENCOUNTER — Encounter: Payer: Managed Care, Other (non HMO) | Admitting: Urology

## 2019-09-11 ENCOUNTER — Other Ambulatory Visit: Payer: Self-pay

## 2019-09-11 DIAGNOSIS — R3913 Splitting of urinary stream: Secondary | ICD-10-CM

## 2019-09-11 MED ORDER — TAMSULOSIN HCL 0.4 MG PO CAPS: 0.4000 mg | ORAL_CAPSULE | Freq: Every day | ORAL | 6 refills | Status: DC

## 2020-02-21 ENCOUNTER — Ambulatory Visit: Payer: Managed Care, Other (non HMO) | Attending: Internal Medicine

## 2020-02-21 DIAGNOSIS — Z23 Encounter for immunization: Secondary | ICD-10-CM

## 2020-02-21 NOTE — Progress Notes (Signed)
   Covid-19 Vaccination Clinic  Name:  GUILFORD SHANNAHAN    MRN: [id number] DOB: 08-29-1986  02/21/2020  Mr. Paragas was observed post Covid-19 immunization for 15 minutes without incident. He was provided with Vaccine Information Sheet and instruction to access the V-Safe system.   Mr. Speigner was instructed to call 911 with any severe reactions post vaccine: Marland Kitchen Difficulty breathing  . Swelling of face and throat  . A fast heartbeat  . A bad rash all over body  . Dizziness and weakness   Immunizations Administered    Name Date Dose VIS Date Route   Pfizer COVID-19 Vaccine 02/21/2020 11:06 AM 0.3 mL 10/24/2019 Intramuscular   Manufacturer: [employer], [employer]   Lot: [id number]   NDC: [address]-4

## 2020-03-30 ENCOUNTER — Ambulatory Visit: Payer: Managed Care, Other (non HMO) | Attending: Internal Medicine

## 2020-03-30 DIAGNOSIS — Z23 Encounter for immunization: Secondary | ICD-10-CM

## 2020-03-30 NOTE — Progress Notes (Signed)
   Covid-19 Vaccination Clinic  Name:  Perry Morris    MRN: [id number] DOB: 11/12/1986  03/30/2020  [name] was observed post Covid-19 immunization for 15 minutes without incident. He was provided with Vaccine Information Sheet and instruction to access the V-Safe system.   [name] was instructed to call 911 with any severe reactions post vaccine: Marland Kitchen Difficulty breathing  . Swelling of face and throat  . A fast heartbeat  . A bad rash all over body  . Dizziness and weakness   Immunizations Administered    Name Date Dose VIS Date Route   Pfizer COVID-19 Vaccine 03/30/2020  3:27 PM 0.3 mL 01/07/2019 Intramuscular   Manufacturer: [employer], [employer]   Lot: [id number]   NDC: [address]-6

## 2020-03-31 ENCOUNTER — Ambulatory Visit: Payer: Managed Care, Other (non HMO)

## 2020-04-04 ENCOUNTER — Other Ambulatory Visit: Payer: Self-pay | Admitting: Urology

## 2020-04-04 MED ORDER — DIAZEPAM 10 MG PO TABS: ORAL_TABLET | ORAL | 0 refills | Status: DC

## 2020-04-22 ENCOUNTER — Encounter: Payer: Managed Care, Other (non HMO) | Admitting: Urology

## 2020-04-26 ENCOUNTER — Ambulatory Visit (INDEPENDENT_AMBULATORY_CARE_PROVIDER_SITE_OTHER): Payer: Managed Care, Other (non HMO) | Admitting: Urology

## 2020-04-26 ENCOUNTER — Encounter: Payer: Self-pay | Admitting: Urology

## 2020-04-26 ENCOUNTER — Other Ambulatory Visit: Payer: Self-pay

## 2020-04-26 VITALS — BP 138/72 | HR 85 | Ht 67.0 in | Wt 230.0 lb

## 2020-04-26 DIAGNOSIS — Z302 Encounter for sterilization: Secondary | ICD-10-CM | POA: Diagnosis not present

## 2020-04-26 MED ORDER — HYDROCODONE-ACETAMINOPHEN 5-325 MG PO TABS: 1.0000 | ORAL_TABLET | Freq: Four times a day (QID) | ORAL | 0 refills | Status: DC | PRN

## 2020-04-26 NOTE — Progress Notes (Signed)
Vasectomy Procedure Note  Indications: The patient is a 34 y.o. male who presents today for elective sterilization.  He has been consented for the procedure.  He is aware of the risks and benefits.  He had no additional questions.  He agrees to proceed.  He denies any other significant change since his last visit.  Pre-operative Diagnosis: Elective sterilization  Post-operative Diagnosis: Elective sterilization  Premedication: Valium 10 mg po  Surgeon: Lorin Picket C. Nuno Brubacher, M.D  Description: The patient was prepped and draped in the standard fashion.  The right vas deferens was identified and brought superiorly to the anterior scrotal skin.  The skin and vas was then anesthetized utilizing 8 ml 1% lidocaine.  A small stab incision was made and spread with the vas dissector.  The vas was grasped utilizing the vas clamp and elevated out of the incision.  The vas was dissected free from surrounding tissue and vessels and an ~1 cm segment was excised.  The vas lumens were cauterized utilizing electrocautery.  The distal segment was buried in the surrounding sheath with a 3-0 chromic suture.  No significant bleeding was observed.  The vas ends were then dropped back into the hemiscrotum.  The skin was closed with hemostatic pressure.  An identical procedure was performed on the contralateral side.  Clean dry gauze was applied to the incision sites.  The patient tolerated the procedure well.  Complications:None  Recommendations: 1.  No lifting greater than 10 pounds or strenuousactivity for 1 week. 2.  Scrotal support for 1 week. 3.  Shower only for 1 week; may shower in the morning 4.  May resume intercourse in one week if no significant discomfort.  Continue alternate contraception for 12 weeks.  5.  Call for significant pain, swelling, redness, drainage or fever greater than 100.5. 6.  Rx hydrocodone/APAP 5/325 1-2 every 6 hours as needed for pain. 7.  Follow-up semen analysis in 12 weeks.     By signing my name below, I, [employer], attest that this documentation has been prepared under the direction and in the presence of Irineo Axon, MD. Electronically Signed: Riki Altes, MD 04/27/20, 6:52 PM   I have reviewed the above documentation for accuracy and completeness, and I agree with the above.   Riki Altes, MD

## 2020-05-27 ENCOUNTER — Other Ambulatory Visit: Payer: Self-pay

## 2020-05-27 ENCOUNTER — Ambulatory Visit (INDEPENDENT_AMBULATORY_CARE_PROVIDER_SITE_OTHER): Payer: Managed Care, Other (non HMO) | Admitting: Nurse Practitioner

## 2020-05-27 VITALS — HR 73 | Temp 97.5°F

## 2020-05-27 DIAGNOSIS — K644 Residual hemorrhoidal skin tags: Secondary | ICD-10-CM

## 2020-05-27 DIAGNOSIS — K625 Hemorrhage of anus and rectum: Secondary | ICD-10-CM | POA: Diagnosis not present

## 2020-05-27 DIAGNOSIS — Z8371 Family history of colonic polyps: Secondary | ICD-10-CM | POA: Diagnosis not present

## 2020-05-27 LAB — COMPREHENSIVE METABOLIC PANEL WITH GFR
ALT: 23 U/L (ref 0–53)
AST: 19 U/L (ref 0–37)
Albumin: 4.9 g/dL (ref 3.5–5.2)
Alkaline Phosphatase: 52 U/L (ref 39–117)
BUN: 20 mg/dL (ref 6–23)
CO2: 29 meq/L (ref 19–32)
Calcium: 9.8 mg/dL (ref 8.4–10.5)
Chloride: 101 meq/L (ref 96–112)
Creatinine, Ser: 1.17 mg/dL (ref 0.40–1.50)
GFR: 70.63 mL/min
Glucose, Bld: 91 mg/dL (ref 70–99)
Potassium: 4.7 meq/L (ref 3.5–5.1)
Sodium: 137 meq/L (ref 135–145)
Total Bilirubin: 0.7 mg/dL (ref 0.2–1.2)
Total Protein: 7.2 g/dL (ref 6.0–8.3)

## 2020-05-27 LAB — CBC WITH DIFFERENTIAL/PLATELET
Basophils Absolute: 0 K/uL (ref 0.0–0.1)
Basophils Relative: 0.5 % (ref 0.0–3.0)
Eosinophils Absolute: 0.1 K/uL (ref 0.0–0.7)
Eosinophils Relative: 1.5 % (ref 0.0–5.0)
HCT: 46.2 % (ref 39.0–52.0)
Hemoglobin: 15.8 g/dL (ref 13.0–17.0)
Lymphocytes Relative: 30.4 % (ref 12.0–46.0)
Lymphs Abs: 2 K/uL (ref 0.7–4.0)
MCHC: 34.2 g/dL (ref 30.0–36.0)
MCV: 88.2 fl (ref 78.0–100.0)
Monocytes Absolute: 0.7 K/uL (ref 0.1–1.0)
Monocytes Relative: 9.7 % (ref 3.0–12.0)
Neutro Abs: 3.9 K/uL (ref 1.4–7.7)
Neutrophils Relative %: 57.9 % (ref 43.0–77.0)
Platelets: 249 K/uL (ref 150.0–400.0)
RBC: 5.24 Mil/uL (ref 4.22–5.81)
RDW: 13.5 % (ref 11.5–15.5)
WBC: 6.7 K/uL (ref 4.0–10.5)

## 2020-05-27 MED ORDER — HYDROCORTISONE (PERIANAL) 2.5 % EX CREA: 1.0000 "application " | TOPICAL_CREAM | Freq: Two times a day (BID) | CUTANEOUS | 0 refills | Status: AC

## 2020-05-27 NOTE — Progress Notes (Signed)
Established Patient Office Visit  Subjective:  Patient ID: Perry Morris, male    DOB: [birth year]  Age: 34 y.o. MRN: [id number]  CC:  Chief Complaint  Patient presents with  . Hemorrhoids    HPI Perry Mccarthy Brucatois a 34 yo who presents for a new problem of hemorrhoids.    He reports onset over the last the week of hemorrhoidal pain, scant blood on the toilet tissue.  He has been using over-the-counter hemorrhoidal cream, soaking in Epson salts over the last 3 to 4 days without improvement.  He has seen a little blood on the toilet tissue and it may be just a little in the commode as well.  He reports a normal regular bowel movement every day.  It is not extremely painful when he has a bowel movement and he does not feel like he is passing razor blades.  He can recall while he was in college and doing heavy  weightlifting that he had a hemorrhoid problem at that time.  He has had no specific triggers currently.  There is no family history of colon cancer but positive for colon polyps.  His mother and brother also have had problems with hemorrhoids.   Past Medical History:  Diagnosis Date  . GERD (gastroesophageal reflux disease)    rare-no meds  . Headache    h/o migraines    Past Surgical History:  Procedure Laterality Date  . ORIF ANKLE FRACTURE Left 07/13/2016   Procedure: OPEN REDUCTION INTERNAL FIXATION (ORIF) ANKLE FRACTURE;  Surgeon: Thornton Park, MD;  Location: [facility] ORS;  Service: Orthopedics;  Laterality: Left;  . WISDOM TOOTH EXTRACTION      Family History  Problem Relation Age of Onset  . Stroke Father 61       thrombotic,  carotid artery   . Cancer Brother 82       lymphoma    Social History   Socioeconomic History  . Marital status: Divorced    Spouse name: Not on file  . Number of children: Not on file  . Years of education: Not on file  . Highest education level: Not on file  Occupational History  . Not on file  Tobacco Use  . Smoking status:  Never Smoker  . Smokeless tobacco: Never Used  Substance and Sexual Activity  . Alcohol use: No  . Drug use: No  . Sexual activity: Not on file  Other Topics Concern  . Not on file  Social History Narrative      He has been divorced  due to his wife's drug abuse.  He has been awarded custody of his two children ages 46 and 82.  He was tested for STDS in December after his divorcce finalized.    Social Determinants of Health   Financial Resource Strain:   . Difficulty of Paying Living Expenses:   Food Insecurity:   . Worried About Charity fundraiser in the Last Year:   . Arboriculturist in the Last Year:   Transportation Needs:   . Film/video editor (Medical):   Marland Kitchen Lack of Transportation (Non-Medical):   Physical Activity:   . Days of Exercise per Week:   . Minutes of Exercise per Session:   Stress:   . Feeling of Stress :   Social Connections:   . Frequency of Communication with Friends and Family:   . Frequency of Social Gatherings with Friends and Family:   . Attends Religious Services:   .  Active Member of Clubs or Organizations:   . Attends Archivist Meetings:   Marland Kitchen Marital Status:   Intimate Partner Violence:   . Fear of Current or Ex-Partner:   . Emotionally Abused:   Marland Kitchen Physically Abused:   . Sexually Abused:     Outpatient Medications Prior to Visit  Medication Sig Dispense Refill  . diazepam (VALIUM) 10 MG tablet 1 tab po 30 min prior to procedure 1 tablet 0  . HYDROcodone-acetaminophen (NORCO/VICODIN) 5-325 MG tablet Take 1 tablet by mouth every 6 (six) hours as needed for moderate pain. 10 tablet 0  . tamsulosin (FLOMAX) 0.4 MG CAPS capsule Take 1 capsule (0.4 mg total) by mouth daily. 30 capsule 6   No facility-administered medications prior to visit.    No Known Allergies  ROS Review of Systems  Constitutional: Negative for chills and fever.  HENT: Negative.   Respiratory: Negative.   Cardiovascular: Negative.   Gastrointestinal:  Positive for blood in stool, constipation and rectal pain. Negative for abdominal pain.  Genitourinary: Negative for difficulty urinating, penile swelling and scrotal swelling.  Psychiatric/Behavioral: Negative.       Objective:    Physical Exam Vitals reviewed.  Constitutional:      Appearance: Normal appearance.  HENT:     Head: Normocephalic and atraumatic.  Cardiovascular:     Rate and Rhythm: Normal rate and regular rhythm.     Pulses: Normal pulses.     Heart sounds: Normal heart sounds.  Pulmonary:     Effort: Pulmonary effort is normal.     Breath sounds: Normal breath sounds.  Abdominal:     Palpations: Abdomen is soft.     Tenderness: There is no abdominal tenderness.  Genitourinary:    Rectum: Tenderness and external hemorrhoid present.     Comments: Rectal exam with visible non thrombosed external hemorrhoid, tender-no fissure, or bleeding. Too tender for DRE.  Musculoskeletal:        General: Normal range of motion.  Skin:    General: Skin is warm and dry.  Neurological:     General: No focal deficit present.     Mental Status: He is alert and oriented to person, place, and time.  Psychiatric:        Mood and Affect: Mood normal.        Behavior: Behavior normal.     There were no vitals taken for this visit. Wt Readings from Last 3 Encounters:  04/26/20 230 lb (104.3 kg)  07/17/19 235 lb (106.6 kg)  06/01/17 232 lb 6.4 oz (105.4 kg)     Health Maintenance Due  Topic Date Due  . Hepatitis C Screening  Never done    There are no preventive care reminders to display for this patient.  Lab Results  Component Value Date   TSH 1.37 03/17/2013   Lab Results  Component Value Date   WBC 6.7 05/27/2020   HGB 15.8 05/27/2020   HCT 46.2 05/27/2020   MCV 88.2 05/27/2020   PLT 249.0 05/27/2020   Lab Results  Component Value Date   NA 137 05/27/2020   K 4.7 05/27/2020   CO2 29 05/27/2020   GLUCOSE 91 05/27/2020   BUN 20 05/27/2020   CREATININE  1.17 05/27/2020   BILITOT 0.7 05/27/2020   ALKPHOS 52 05/27/2020   AST 19 05/27/2020   ALT 23 05/27/2020   PROT 7.2 05/27/2020   ALBUMIN 4.9 05/27/2020   CALCIUM 9.8 05/27/2020   ANIONGAP 5 07/12/2016  GFR 70.63 05/27/2020   Lab Results  Component Value Date   CHOL 143 06/05/2019   Lab Results  Component Value Date   HDL 45.50 06/05/2019   Lab Results  Component Value Date   LDLCALC 85 06/05/2019   Lab Results  Component Value Date   TRIG 64.0 06/05/2019   Lab Results  Component Value Date   CHOLHDL 3 06/05/2019   Lab Results  Component Value Date   HGBA1C 5.2 06/05/2019      Assessment & Plan:   Problem List Items Addressed This Visit      Cardiovascular and Mediastinum   External hemorrhoid - Primary   Relevant Orders   Ambulatory referral to Gastroenterology    Other Visit Diagnoses    FH: colon polyps       Relevant Orders   Ambulatory referral to Gastroenterology   Rectal bleeding       Relevant Orders   Ambulatory referral to Gastroenterology   CBC with Differential/Platelet (Completed)   Comp Met (CMET) (Completed)      Meds ordered this encounter  Medications  . hydrocortisone (ANUSOL-HC) 2.5 % rectal cream    Sig: Place 1 application rectally 2 (two) times daily for 7 days.    Dispense:  21 g    Refill:  0    Order Specific Question:   Supervising Provider    Answer:   Einar Pheasant [[id number]]  You have an external hemorrhoid.  It is nonthrombosed.  We discussed hemorrhoids as varicose veins, and measures to decrease the pressure.  Advised to avoid any type of straining.  He may use stool softeners, and add Citrucel over the counter-if needed.  Avoid diarrhea.  Drink extra fluids.  Advised to notify us if he develops increasing pain, worsening rectal bleeding, or the external hemorrhoid turns blue color.  He would then need surgical consult.  I did place a GI referral in.  He reports family history of colon polyps.   Anusol cream twice  daily for up to 1 week. Sitz baths  to reduce inflammation and edema and relax the sphincter muscles two to three times per day. Lab check today.   See Dr. Derrel Nip for routine health care    Follow-up: Return in about 4 weeks (around 06/24/2020).  This visit occurred during the SARS-CoV-2 public health emergency.  Safety protocols were in place, including screening questions prior to the visit, additional usage of staff PPE, and extensive cleaning of exam room while observing appropriate contact time as indicated for disinfecting solutions.    Denice Paradise, NP

## 2020-05-27 NOTE — Patient Instructions (Addendum)
You have an external hemorrhoid. Use  stool softeners, making sure that the stool it passes through is soft without any straining. Add Citrucel over the counter- drink ectra fluids.    Anusol cream twice daily for up to 1 week.  Sitz baths in addition to topical treatment for acute flare-ups of hemorrhoids to reduce inflammation and edema and relax the sphincter muscles two to three times per day.  Lab check today.  See Dr. Darrick Huntsman for routine health care  Hemorrhoids Hemorrhoids are swollen veins in and around the rectum or anus. There are two types of hemorrhoids:  Internal hemorrhoids. These occur in the veins that are just inside the rectum. They may poke through to the outside and become irritated and painful.  External hemorrhoids. These occur in the veins that are outside the anus and can be felt as a painful swelling or hard lump near the anus. Most hemorrhoids do not cause serious problems, and they can be managed with home treatments such as diet and lifestyle changes. If home treatments do not help the symptoms, procedures can be done to shrink or remove the hemorrhoids. What are the causes? This condition is caused by increased pressure in the anal area. This pressure may result from various things, including:  Constipation.  Straining to have a bowel movement.  Diarrhea.  Pregnancy.  Obesity.  Sitting for long periods of time.  Heavy lifting or other activity that causes you to strain.  Anal sex.  Riding a bike for a long period of time. What are the signs or symptoms? Symptoms of this condition include:  Pain.  Anal itching or irritation.  Rectal bleeding.  Leakage of stool (feces).  Anal swelling.  One or more lumps around the anus. How is this diagnosed? This condition can often be diagnosed through a visual exam. Other exams or tests may also be done, such as:  An exam that involves feeling the rectal area with a gloved hand (digital rectal  exam).  An exam of the anal canal that is done using a small tube (anoscope).  A blood test, if you have lost a significant amount of blood.  A test to look inside the colon using a flexible tube with a camera on the end (sigmoidoscopy or colonoscopy). How is this treated? This condition can usually be treated at home. However, various procedures may be done if dietary changes, lifestyle changes, and other home treatments do not help your symptoms. These procedures can help make the hemorrhoids smaller or remove them completely. Some of these procedures involve surgery, and others do not. Common procedures include:  Rubber band ligation. Rubber bands are placed at the base of the hemorrhoids to cut off their blood supply.  Sclerotherapy. Medicine is injected into the hemorrhoids to shrink them.  Infrared coagulation. A type of light energy is used to get rid of the hemorrhoids.  Hemorrhoidectomy surgery. The hemorrhoids are surgically removed, and the veins that supply them are tied off.  Stapled hemorrhoidopexy surgery. The surgeon staples the base of the hemorrhoid to the rectal wall. Follow these instructions at home: Eating and drinking   Eat foods that have a lot of fiber in them, such as whole grains, beans, nuts, fruits, and vegetables.  Ask your health care provider about taking products that have added fiber (fiber supplements).  Reduce the amount of fat in your diet. You can do this by eating low-fat dairy products, eating less red meat, and avoiding processed foods.  Drink enough  fluid to keep your urine pale yellow. Managing pain and swelling   Take warm sitz baths for 20 minutes, 3-4 times a day to ease pain and discomfort. You may do this in a bathtub or using a portable sitz bath that fits over the toilet.  If directed, apply ice to the affected area. Using ice packs between sitz baths may be helpful. ? Put ice in a plastic bag. ? Place a towel between your skin  and the bag. ? Leave the ice on for 20 minutes, 2-3 times a day. General instructions  Take over-the-counter and prescription medicines only as told by your health care provider.  Use medicated creams or suppositories as told.  Get regular exercise. Ask your health care provider how much and what kind of exercise is best for you. In general, you should do moderate exercise for at least 30 minutes on most days of the week (150 minutes each week). This can include activities such as walking, biking, or yoga.  Go to the bathroom when you have the urge to have a bowel movement. Do not wait.  Avoid straining to have bowel movements.  Keep the anal area dry and clean. Use wet toilet paper or moist towelettes after a bowel movement.  Do not sit on the toilet for long periods of time. This increases blood pooling and pain.  Keep all follow-up visits as told by your health care provider. This is important. Contact a health care provider if you have:  Increasing pain and swelling that are not controlled by treatment or medicine.  Difficulty having a bowel movement, or you are unable to have a bowel movement.  Pain or inflammation outside the area of the hemorrhoids. Get help right away if you have:  Uncontrolled bleeding from your rectum. Summary  Hemorrhoids are swollen veins in and around the rectum or anus.  Most hemorrhoids can be managed with home treatments such as diet and lifestyle changes.  Taking warm sitz baths can help ease pain and discomfort.  In severe cases, procedures or surgery can be done to shrink or remove the hemorrhoids. This information is not intended to replace advice given to you by your health care provider. Make sure you discuss any questions you have with your health care provider. Document Revised: 03/28/2019 Document Reviewed: 03/21/2018 Elsevier Patient Education  2020 [employer].

## 2020-05-30 ENCOUNTER — Encounter: Payer: Self-pay | Admitting: Nurse Practitioner

## 2020-06-25 ENCOUNTER — Ambulatory Visit (INDEPENDENT_AMBULATORY_CARE_PROVIDER_SITE_OTHER): Payer: Managed Care, Other (non HMO) | Admitting: Internal Medicine

## 2020-06-25 ENCOUNTER — Encounter: Payer: Self-pay | Admitting: Internal Medicine

## 2020-06-25 ENCOUNTER — Other Ambulatory Visit: Payer: Self-pay

## 2020-06-25 VITALS — BP 108/64 | HR 71 | Temp 98.3°F | Resp 15 | Ht 67.0 in | Wt 259.4 lb

## 2020-06-25 DIAGNOSIS — Z113 Encounter for screening for infections with a predominantly sexual mode of transmission: Secondary | ICD-10-CM

## 2020-06-25 DIAGNOSIS — R635 Abnormal weight gain: Secondary | ICD-10-CM

## 2020-06-25 DIAGNOSIS — I499 Cardiac arrhythmia, unspecified: Secondary | ICD-10-CM | POA: Diagnosis not present

## 2020-06-25 DIAGNOSIS — E66812 Obesity, class 2: Secondary | ICD-10-CM

## 2020-06-25 DIAGNOSIS — H9191 Unspecified hearing loss, right ear: Secondary | ICD-10-CM

## 2020-06-25 DIAGNOSIS — Z1159 Encounter for screening for other viral diseases: Secondary | ICD-10-CM | POA: Diagnosis not present

## 2020-06-25 DIAGNOSIS — E669 Obesity, unspecified: Secondary | ICD-10-CM

## 2020-06-25 LAB — HEMOGLOBIN A1C: Hgb A1c MFr Bld: 5.4 % (ref 4.6–6.5)

## 2020-06-25 LAB — TSH: TSH: 1.74 u[IU]/mL (ref 0.35–4.50)

## 2020-06-25 LAB — MAGNESIUM: Magnesium: 2 mg/dL (ref 1.5–2.5)

## 2020-06-25 NOTE — Progress Notes (Signed)
Subjective:  Patient ID: Perry Morris, male    DOB: 06-15-1986  Age: 34 y.o. MRN: [id number]  CC: The primary encounter diagnosis was Cardiac arrhythmia, unspecified cardiac arrhythmia type. Diagnoses of Weight gain, Hearing loss of right ear, unspecified hearing loss type, Encounter for hepatitis C screening test for low risk patient, Screen for STD (sexually transmitted disease), and Class 2 obesity without serious comorbidity in adult, unspecified BMI, unspecified obesity type were also pertinent to this visit.  HPI Perry Morris presents for follow up  This visit occurred during the SARS-CoV-2 public health emergency.  Safety protocols were in place, including screening questions prior to the visit, additional usage of staff PPE, and extensive cleaning of exam room while observing appropriate contact time as indicated for disinfecting solutions.   1) arrhythmia  Started 1.5 months ago while out at lunch with coworkers.  Developed a panic attack ("wave of anxiety " out of nowhere) resolved after 15 minutes .  Pulse  felt like it was fluttering  For a second.  . mother had a similar occurrence in her 19's.   Has been happening 2-3 times per week.  Occurs with quiet activities . No change with suspension of caffeine although his use is minimal.   notes increased stressors  Over the past 2.5 years,  Separated from wife 2 years ago due to her drug abuse.  Now divorced , wife has a lot of psychiatric  issues. Has custody of kids,  Remarried and happy IN CURRENT MARRIAGE   2) right sided hearing loss    No history of barotrauma . Incidental finding with new wife when lying on left side could not hear her    3) weight gain of 15 lbs in the past year.  Exercising 3 times weekly   4) feels guilty at times for children's unhappiness caused by the divorce from their mother Victorino Dike   No outpatient medications prior to visit.   No facility-administered medications prior to visit.    Review  of Systems;  Patient denies headache, fevers, malaise, unintentional weight loss, skin rash, eye pain, sinus congestion and sinus pain, sore throat, dysphagia,  hemoptysis , cough, dyspnea, wheezing, chest pain, palpitations, orthopnea, edema, abdominal pain, nausea, melena, diarrhea, constipation, flank pain, dysuria, hematuria, urinary  Frequency, nocturia, numbness, tingling, seizures,  Focal weakness, Loss of consciousness,  Tremor, insomnia, depression, anxiety, and suicidal ideation.      Objective:  BP 108/64 (BP Location: Left Arm, Patient Position: Sitting, Cuff Size: Large)   Pulse 71   Temp 98.3 F (36.8 C) (Oral)   Resp 15   Ht 5\' 7"  (1.702 m)   Wt 259 lb 6.4 oz (117.7 kg)   SpO2 98%   BMI 40.63 kg/m   BP Readings from Last 3 Encounters:  06/25/20 108/64  04/26/20 138/72  07/17/19 127/77    Wt Readings from Last 3 Encounters:  06/25/20 259 lb 6.4 oz (117.7 kg)  04/26/20 230 lb (104.3 kg)  07/17/19 235 lb (106.6 kg)    General appearance: alert, cooperative and appears stated age Ears: normal TM's and external ear canals both ears Throat: lips, mucosa, and tongue normal; teeth and gums normal Neck: no adenopathy, no carotid bruit, supple, symmetrical, trachea midline and thyroid not enlarged, symmetric, no tenderness/mass/nodules Back: symmetric, no curvature. ROM normal. No CVA tenderness. Lungs: clear to auscultation bilaterally Heart: regular rate and rhythm, S1, S2 normal, no murmur, click, rub or gallop Abdomen: soft, non-tender; bowel sounds normal; no  masses,  no organomegaly Pulses: 2+ and symmetric Skin: Skin color, texture, turgor normal. No rashes or lesions Lymph nodes: Cervical, supraclavicular, and axillary nodes normal.  Lab Results  Component Value Date   HGBA1C 5.4 06/25/2020   HGBA1C 5.2 06/05/2019    Lab Results  Component Value Date   CREATININE 1.17 05/27/2020   CREATININE 1.10 06/05/2019   CREATININE 1.39 06/01/2017    Lab  Results  Component Value Date   WBC 6.7 05/27/2020   HGB 15.8 05/27/2020   HCT 46.2 05/27/2020   PLT 249.0 05/27/2020   GLUCOSE 91 05/27/2020   CHOL 143 06/05/2019   TRIG 64.0 06/05/2019   HDL 45.50 06/05/2019   LDLCALC 85 06/05/2019   ALT 23 05/27/2020   AST 19 05/27/2020   NA 137 05/27/2020   K 4.7 05/27/2020   CL 101 05/27/2020   CREATININE 1.17 05/27/2020   BUN 20 05/27/2020   CO2 29 05/27/2020   TSH 1.74 06/25/2020   PSA 0.97 06/05/2019   INR 0.91 07/12/2016   HGBA1C 5.4 06/25/2020    No results found.  Assessment & Plan:   Problem List Items Addressed This Visit      Unprioritized   Obesity, unspecified   Screen for STD (sexually transmitted disease)   Cardiac arrhythmia - Primary    sympotms are very brief and not accompaneid by chest pain, dizziness,  Or presyncope.  I have ordered and reviewed a 12 lead EKG and find that there are no acute changes and patient is in sinus rhythm.   Ruling out metabolic causes.   Lab Results  Component Value Date   TSH 1.74 06/25/2020   Last metabolic panel Lab Results  Component Value Date   GLUCOSE 91 05/27/2020   NA 137 05/27/2020   K 4.7 05/27/2020   CL 101 05/27/2020   CO2 29 05/27/2020   BUN 20 05/27/2020   CREATININE 1.17 05/27/2020   GFRNONAA >60 07/12/2016   GFRAA >60 07/12/2016   CALCIUM 9.8 05/27/2020   PROT 7.2 05/27/2020   ALBUMIN 4.9 05/27/2020   BILITOT 0.7 05/27/2020   ALKPHOS 52 05/27/2020   AST 19 05/27/2020   ALT 23 05/27/2020   ANIONGAP 5 07/12/2016     Lab Results  Component Value Date   WBC 6.7 05/27/2020   HGB 15.8 05/27/2020   HCT 46.2 05/27/2020   MCV 88.2 05/27/2020   PLT 249.0 05/27/2020         Relevant Orders   Magnesium (Completed)   EKG 12-Lead (Completed)   Hearing loss of right ear    Nontraumatic. Ear exam negative for cerumen .  Referral to [facility]       Relevant Orders   Ambulatory referral to ENT   Weight gain   Relevant Orders   TSH (Completed)    Hemoglobin A1c (Completed)    Other Visit Diagnoses    Encounter for hepatitis C screening test for low risk patient       Relevant Orders   Hepatitis C antibody    A total of 40 minutes of face to face time was spent with patient more than half of which was spent in counselling and coordination of care   [facility]" does not currently have medications on file.  No orders of the defined types were placed in this encounter.   There are no discontinued medications.  Follow-up: Return in about 6 months (around 12/26/2020).   Sherlene Shams, MD

## 2020-06-25 NOTE — Patient Instructions (Signed)
Referral to [facility] for hearing evaluation   Palpitations Palpitations are feelings that your heartbeat is irregular or is faster than normal. It may feel like your heart is fluttering or skipping a beat. Palpitations are usually not a serious problem. They may be caused by many things, including smoking, caffeine, alcohol, stress, and certain medicines or drugs. Most causes of palpitations are not serious. However, some palpitations can be a sign of a serious problem. You may need further tests to rule out serious medical problems. Follow these instructions at home:     Pay attention to any changes in your condition. Take these actions to help manage your symptoms: Eating and drinking  Avoid foods and drinks that may cause palpitations. These may include: ? Caffeinated coffee, tea, soft drinks, diet pills, and energy drinks. ? Chocolate. ? Alcohol. Lifestyle  Take steps to reduce your stress and anxiety. Things that can help you relax include: ? Yoga. ? Mind-body activities, such as deep breathing, meditation, or using words and images to create positive thoughts (guided imagery). ? Physical activity, such as swimming, jogging, or walking. Tell your health care provider if your palpitations increase with activity. If you have chest pain or shortness of breath with activity, do not continue the activity until you are seen by your health care provider. ? Biofeedback. This is a method that helps you learn to use your mind to control things in your body, such as your heartbeat.  Do not use drugs, including cocaine or ecstasy. Do not use marijuana.  Get plenty of rest and sleep. Keep a regular bed time. General instructions  Take over-the-counter and prescription medicines only as told by your health care provider.  Do not use any products that contain nicotine or tobacco, such as cigarettes and e-cigarettes. If you need help quitting, ask your health care provider.  Keep all  follow-up visits as told by your health care provider. This is important. These may include visits for further testing if palpitations do not go away or get worse. Contact a health care provider if you:  Continue to have a fast or irregular heartbeat after 24 hours.  Notice that your palpitations occur more often. Get help right away if you:  Have chest pain or shortness of breath.  Have a severe headache.  Feel dizzy or you faint. Summary  Palpitations are feelings that your heartbeat is irregular or is faster than normal. It may feel like your heart is fluttering or skipping a beat.  Palpitations may be caused by many things, including smoking, caffeine, alcohol, stress, certain medicines, and drugs.  Although most causes of palpitations are not serious, some causes can be a sign of a serious medical problem.  Get help right away if you faint or have chest pain, shortness of breath, a severe headache, or dizziness. This information is not intended to replace advice given to you by your health care provider. Make sure you discuss any questions you have with your health care provider. Document Revised: 12/12/2017 Document Reviewed: 12/12/2017 Elsevier Patient Education  2020 [employer].

## 2020-06-27 DIAGNOSIS — H9191 Unspecified hearing loss, right ear: Secondary | ICD-10-CM | POA: Insufficient documentation

## 2020-06-27 DIAGNOSIS — R635 Abnormal weight gain: Secondary | ICD-10-CM | POA: Insufficient documentation

## 2020-06-27 DIAGNOSIS — I499 Cardiac arrhythmia, unspecified: Secondary | ICD-10-CM | POA: Insufficient documentation

## 2020-06-27 DIAGNOSIS — E669 Obesity, unspecified: Secondary | ICD-10-CM | POA: Insufficient documentation

## 2020-06-27 DIAGNOSIS — Z6836 Body mass index (BMI) 36.0-36.9, adult: Secondary | ICD-10-CM | POA: Insufficient documentation

## 2020-06-27 NOTE — Assessment & Plan Note (Addendum)
sympotms are very brief and not accompaneid by chest pain, dizziness,  Or presyncope.  I have ordered and reviewed a 12 lead EKG and find that there are no acute changes and patient is in sinus rhythm.   Ruling out metabolic causes.   Lab Results  Component Value Date   TSH 1.74 06/25/2020   Last metabolic panel Lab Results  Component Value Date   GLUCOSE 91 05/27/2020   NA 137 05/27/2020   K 4.7 05/27/2020   CL 101 05/27/2020   CO2 29 05/27/2020   BUN 20 05/27/2020   CREATININE 1.17 05/27/2020   GFRNONAA >60 07/12/2016   GFRAA >60 07/12/2016   CALCIUM 9.8 05/27/2020   PROT 7.2 05/27/2020   ALBUMIN 4.9 05/27/2020   BILITOT 0.7 05/27/2020   ALKPHOS 52 05/27/2020   AST 19 05/27/2020   ALT 23 05/27/2020   ANIONGAP 5 07/12/2016     Lab Results  Component Value Date   WBC 6.7 05/27/2020   HGB 15.8 05/27/2020   HCT 46.2 05/27/2020   MCV 88.2 05/27/2020   PLT 249.0 05/27/2020

## 2020-06-27 NOTE — Assessment & Plan Note (Signed)
Nontraumatic. Ear exam negative for cerumen .  Referral to [facility]

## 2020-06-28 LAB — HEPATITIS C ANTIBODY
Hepatitis C Ab: NONREACTIVE
SIGNAL TO CUT-OFF: 0.03 (ref <1.00)

## 2020-07-01 ENCOUNTER — Other Ambulatory Visit: Payer: Self-pay | Admitting: Family Medicine

## 2020-07-01 ENCOUNTER — Encounter: Payer: Self-pay | Admitting: Urology

## 2020-07-01 MED ORDER — TAMSULOSIN HCL 0.4 MG PO CAPS
0.4000 mg | ORAL_CAPSULE | Freq: Every day | ORAL | 0 refills | Status: DC
Start: 2020-07-01 — End: 2020-12-30

## 2020-07-05 ENCOUNTER — Telehealth: Payer: Self-pay | Admitting: Internal Medicine

## 2020-07-05 NOTE — Telephone Encounter (Signed)
LVM FOR PT ON 8/13" [facility] said 7 days ago  "Rejection Reason - Patient did not respond - pt did not cb to sched appt" [facility] [place] said about 1 hour ago

## 2020-07-14 ENCOUNTER — Other Ambulatory Visit (HOSPITAL_COMMUNITY): Payer: Self-pay | Admitting: Unknown Physician Specialty

## 2020-07-14 ENCOUNTER — Other Ambulatory Visit: Payer: Self-pay | Admitting: Unknown Physician Specialty

## 2020-07-14 DIAGNOSIS — H9071 Mixed conductive and sensorineural hearing loss, unilateral, right ear, with unrestricted hearing on the contralateral side: Secondary | ICD-10-CM

## 2020-07-28 ENCOUNTER — Other Ambulatory Visit: Payer: Self-pay

## 2020-07-28 DIAGNOSIS — Z302 Encounter for sterilization: Secondary | ICD-10-CM

## 2020-07-29 ENCOUNTER — Other Ambulatory Visit: Payer: Managed Care, Other (non HMO)

## 2020-07-29 ENCOUNTER — Other Ambulatory Visit: Payer: Self-pay

## 2020-07-29 DIAGNOSIS — Z302 Encounter for sterilization: Secondary | ICD-10-CM

## 2020-07-30 ENCOUNTER — Ambulatory Visit: Payer: Managed Care, Other (non HMO) | Admitting: Gastroenterology

## 2020-07-31 ENCOUNTER — Encounter: Payer: Self-pay | Admitting: Urology

## 2020-07-31 LAB — POST-VAS SPERM EVALUATION,QUAL: Volume: 4.1 mL

## 2020-08-02 ENCOUNTER — Telehealth: Payer: Self-pay | Admitting: Internal Medicine

## 2020-08-02 NOTE — Telephone Encounter (Signed)
Rejection Reason - Patient Declined" [facility] said on Aug 02, 2020 10:14 AM

## 2020-08-03 ENCOUNTER — Other Ambulatory Visit: Payer: Self-pay

## 2020-08-03 ENCOUNTER — Ambulatory Visit
Admission: RE | Admit: 2020-08-03 | Discharge: 2020-08-03 | Disposition: A | Discharge status: Home / Self Care | Payer: Managed Care, Other (non HMO) | Source: Ambulatory Visit | Attending: Unknown Physician Specialty | Admitting: Unknown Physician Specialty

## 2020-08-03 DIAGNOSIS — H9071 Mixed conductive and sensorineural hearing loss, unilateral, right ear, with unrestricted hearing on the contralateral side: Secondary | ICD-10-CM | POA: Insufficient documentation

## 2020-08-03 MED ORDER — GADOBUTROL 1 MMOL/ML IV SOLN
10.0000 mL | Freq: Once | INTRAVENOUS | Status: AC | PRN
  Administered 2020-08-03: 10 mL via INTRAVENOUS

## 2020-09-17 ENCOUNTER — Other Ambulatory Visit (HOSPITAL_COMMUNITY): Payer: Self-pay | Admitting: Nurse Practitioner

## 2020-09-17 ENCOUNTER — Other Ambulatory Visit: Payer: Self-pay | Admitting: Nurse Practitioner

## 2020-09-17 DIAGNOSIS — R937 Abnormal findings on diagnostic imaging of other parts of musculoskeletal system: Secondary | ICD-10-CM

## 2020-11-28 ENCOUNTER — Emergency Department: Payer: Managed Care, Other (non HMO)

## 2020-11-28 ENCOUNTER — Emergency Department
Admission: EM | Admit: 2020-11-28 | Discharge: 2020-11-28 | Disposition: A | Discharge status: Home / Self Care | Payer: Managed Care, Other (non HMO) | Attending: Emergency Medicine | Admitting: Emergency Medicine

## 2020-11-28 ENCOUNTER — Other Ambulatory Visit: Payer: Self-pay

## 2020-11-28 ENCOUNTER — Encounter: Payer: Self-pay | Admitting: Emergency Medicine

## 2020-11-28 DIAGNOSIS — W009XXA Unspecified fall due to ice and snow, initial encounter: Secondary | ICD-10-CM | POA: Insufficient documentation

## 2020-11-28 DIAGNOSIS — S4992XA Unspecified injury of left shoulder and upper arm, initial encounter: Secondary | ICD-10-CM | POA: Diagnosis not present

## 2020-11-28 NOTE — ED Triage Notes (Signed)
Pt reports was playing in the snow with the kids and fell hurting his shoulder. Pt concerned it is dislocated

## 2020-11-28 NOTE — ED Provider Notes (Signed)
[facility] Emergency Department Provider Note  ____________________________________________   Event Date/Time   First MD Initiated Contact with Patient 11/28/20 1114     (approximate)  I have reviewed the triage vital signs and the nursing notes.   HISTORY  Chief Complaint Shoulder Injury    HPI Perry Morris is a 35 y.o. male otherwise healthy who comes in for shoulder injury.  Patient states that just prior to arrival he was sliding when he fell off the sled and landed onto his left shoulder.  Patient is having pain that is moderate, constant, worse with movement, better at rest.  States that he cannot lift his arm up.  He is able to move his wrist fully.  No pain at the elbow.  Denies any hit his head or loss consciousness.          Past Medical History:  Diagnosis Date  . GERD (gastroesophageal reflux disease)    rare-no meds  . Headache    h/o migraines    Patient Active Problem List   Diagnosis Date Noted  . Cardiac arrhythmia 06/27/2020  . Weight gain 06/27/2020  . 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 External hemorrhoid 05/27/2020  . Urinary stream splitting 06/06/2019  . Nocturia 06/06/2019  . Screen for STD (sexually transmitted disease) 06/06/2019  . Screening for hyperlipidemia 06/06/2019  . Neck pain 08/27/2017  . Cervical radiculopathy 08/08/2017  . Arthralgia 06/01/2017  . BPPV (benign paroxysmal positional vertigo) 07/19/2016  . Closed fracture of lateral malleolus 07/03/2016  . Obesity, unspecified 02/28/2013    Past Surgical History:  Procedure Laterality Date  . ORIF ANKLE FRACTURE Left 07/13/2016   Procedure: OPEN REDUCTION INTERNAL FIXATION (ORIF) ANKLE FRACTURE;  Surgeon: Juanell Fairly, MD;  Location: [facility] ORS;  Service: Orthopedics;  Laterality: Left;  . WISDOM TOOTH EXTRACTION      Prior to Admission medications   Medication Sig Start Date End Date Taking? Authorizing Provider  tamsulosin (FLOMAX)  0.4 MG CAPS capsule Take 1 capsule (0.4 mg total) by mouth daily. 07/01/20   Stoioff, Verna Czech, MD    Allergies Patient has no known allergies.  Family History  Problem Relation Age of Onset  . Stroke Father 11       thrombotic,  carotid artery   . Cancer Brother 22       lymphoma    Social History Social History   Tobacco Use  . Smoking status: Never Smoker  . Smokeless tobacco: Never Used  Substance Use Topics  . Alcohol use: No  . Drug use: No      Review of Systems Constitutional: No fever/chills Eyes: No visual changes. ENT: No sore throat. Cardiovascular: Denies chest pain. Respiratory: Denies shortness of breath. Gastrointestinal: No abdominal pain.  No nausea, no vomiting.  No diarrhea.  No constipation. Genitourinary: Negative for dysuria. Musculoskeletal: Negative for back pain.  Left shoulder pain Skin: Negative for rash. Neurological: Negative for headaches, focal weakness or numbness. All other ROS negative ____________________________________________   PHYSICAL EXAM:  VITAL SIGNS: ED Triage Vitals  Enc Vitals Group     BP 11/28/20 1112 120/69     Pulse Rate 11/28/20 1112 81     Resp 11/28/20 1112 20     Temp 11/28/20 1112 98.7 F (37.1 C)     Temp Source 11/28/20 1112 Oral     SpO2 11/28/20 1112 100 %     Weight 11/28/20 1109 228 lb (103.4 kg)  Height 11/28/20 1109 5\' 7"  (1.702 m)     Head Circumference --      Peak Flow --      Pain Score 11/28/20 1109 6     Pain Loc --      Pain Edu? --      Excl. in GC? --     Constitutional: Alert and oriented. Well appearing and in no acute distress. Eyes: Conjunctivae are normal. EOMI. Head: Atraumatic. Nose: No congestion/rhinnorhea. Mouth/Throat: Mucous membranes are moist.   Neck: No stridor. Trachea Midline. FROM Cardiovascular: Normal rate, regular rhythm. Grossly normal heart sounds.  Good peripheral circulation. Respiratory: Normal respiratory effort.  No retractions. Lungs  CTAB. Gastrointestinal: Soft and nontender. No distention. No abdominal bruits.  Musculoskeletal: Tenderness to the left shoulder.  No tenderness to the elbow or wrist.  Neurovascularly intact.  No joint effusions. Neurologic:  Normal speech and language. No gross focal neurologic deficits are appreciated.  Skin:  Skin is warm, dry and intact. No rash noted. Psychiatric: Mood and affect are normal. Speech and behavior are normal. GU: Deferred   ____________________________________________   RADIOLOGY 11/30/20, personally viewed and evaluated these images (plain radiographs) as part of my medical decision making, as well as reviewing the written report by the radiologist.  ED MD interpretation: Personally reviewed the x-ray I do not see any evidence of dislocation or fracture  Official radiology report(s): DG Shoulder Left  Result Date: 11/28/2020 CLINICAL DATA:  35 year old male status post fall, injury sledding. EXAM: LEFT SHOULDER - 2+ VIEW COMPARISON:  None. FINDINGS: Bone mineralization is within normal limits. There is no evidence of fracture or dislocation. There is no evidence of arthropathy or other focal bone abnormality. Visible left ribs and chest appear negative. IMPRESSION: Negative. Electronically Signed   By: 29 M.D.   On: 11/28/2020 11:40    ____________________________________________   PROCEDURES  Procedure(s) performed (including Critical Care):  Procedures   ____________________________________________   INITIAL IMPRESSION / ASSESSMENT AND PLAN / ED COURSE  Perry Morris was evaluated in Emergency Department on 11/28/2020 for the symptoms described in the history of present illness. He was evaluated in the context of the global COVID-19 pandemic, which necessitated consideration that the patient might be at risk for infection with the SARS-CoV-2 virus that causes COVID-19. Institutional protocols and algorithms that pertain to the evaluation of  patients at risk for COVID-19 are in a state of rapid change based on information released by regulatory bodies including the CDC and federal and state organizations. These policies and algorithms were followed during the patient's care in the ED.    Patient is a 35 year old well-appearing who comes in for shoulder injury.  Will get x-ray to evaluate for fracture versus dislocation.  Patient is currently neurovascularly intact.  Denies any head injury to suggest intracranial hemorrhage.  Denies any other extremity pain or other injuries from the fall  Patient declining pain medication at this time.  We'll proceed with x-ray  X-ray appears normal.  Patient states that he was able to lift up his arm higher than earlier.  We discussed the x-ray findings and discussed that could still be a ligament injury.  We provided a sling for comfort but discussed exercises to prevent frozen shoulder.  We discussed Tylenol and ibuprofen for pain and was given orthopedic number for follow-up if he continues to have discomfort because he may need further evaluation for ligament injury.    ____________________________________________   FINAL CLINICAL IMPRESSION(S) /  ED DIAGNOSES   Final diagnoses:  Shoulder injury, left, initial encounter      MEDICATIONS GIVEN DURING THIS VISIT:  Medications - No data to display   ED Discharge Orders    None       Note:  This document was prepared using Dragon voice recognition software and may include unintentional dictation errors.   Concha Se, MD 11/28/20 1155

## 2020-11-28 NOTE — Discharge Instructions (Signed)
Your x-ray was negative for fracture or dislocation.  However you can wear a sling for the next few days but make sure to take your arm out of the sling to do the exercises we discussed.  Do not want to get a frozen shoulder.  I am giving you the number for the orthopedic doctor if he continues to have discomfort for 2 weeks he may need to have MRI to make sure there are no ligament tears.  Take ibuprofen 600 every 6 hours and Tylenol 1 g every 8 hours to help with pain.  Return to the ER if develop any other concerns

## 2020-12-01 ENCOUNTER — Other Ambulatory Visit: Payer: Self-pay | Admitting: Nurse Practitioner

## 2020-12-01 DIAGNOSIS — R937 Abnormal findings on diagnostic imaging of other parts of musculoskeletal system: Secondary | ICD-10-CM

## 2020-12-08 ENCOUNTER — Encounter: Payer: Self-pay | Admitting: *Deleted

## 2020-12-10 ENCOUNTER — Other Ambulatory Visit: Payer: Self-pay

## 2020-12-10 ENCOUNTER — Ambulatory Visit
Admission: RE | Admit: 2020-12-10 | Discharge: 2020-12-10 | Disposition: A | Discharge status: Home / Self Care | Payer: Managed Care, Other (non HMO) | Source: Ambulatory Visit | Attending: Nurse Practitioner | Admitting: Nurse Practitioner

## 2020-12-10 DIAGNOSIS — R937 Abnormal findings on diagnostic imaging of other parts of musculoskeletal system: Secondary | ICD-10-CM | POA: Diagnosis not present

## 2020-12-10 MED ORDER — GADOBUTROL 1 MMOL/ML IV SOLN
10.0000 mL | Freq: Once | INTRAVENOUS | Status: AC | PRN
  Administered 2020-12-10: 10 mL via INTRAVENOUS

## 2020-12-30 ENCOUNTER — Encounter: Payer: Self-pay | Admitting: Internal Medicine

## 2020-12-30 ENCOUNTER — Other Ambulatory Visit: Payer: Self-pay

## 2020-12-30 ENCOUNTER — Ambulatory Visit (INDEPENDENT_AMBULATORY_CARE_PROVIDER_SITE_OTHER): Payer: Managed Care, Other (non HMO) | Admitting: Internal Medicine

## 2020-12-30 VITALS — BP 112/64 | HR 62 | Temp 98.5°F | Resp 15 | Ht 67.0 in | Wt 232.2 lb

## 2020-12-30 DIAGNOSIS — Z0000 Encounter for general adult medical examination without abnormal findings: Secondary | ICD-10-CM

## 2020-12-30 DIAGNOSIS — E669 Obesity, unspecified: Secondary | ICD-10-CM | POA: Diagnosis not present

## 2020-12-30 DIAGNOSIS — Z23 Encounter for immunization: Secondary | ICD-10-CM

## 2020-12-30 NOTE — Progress Notes (Signed)
Patient ID: Perry Morris, male    DOB: 02-Jul-1986  Age: 35 y.o. MRN: [id number]  The patient is here for annual wellness examination and management of other chronic and acute problems.   The risk factors are reflected in the social history.  The roster of all physicians providing medical care to patient - is listed in the Snapshot section of the chart.  Activities of daily living:  The patient is 100% independent in all ADLs: dressing, toileting, feeding as well as independent mobility  Home safety : The patient has smoke detectors in the home. They wear seatbelts.  There are no firearms at home. There is no violence in the home.   There is no risks for hepatitis, STDs or HIV. There is no   history of blood transfusion. They have no travel history to infectious disease endemic areas of the world.  The patient has seen their dentist in the last six month. They have seen their eye doctor in the last year. They admit to slight hearing difficulty with regard to whispered voices and some television programs.  They have deferred audiologic testing in the last year.  They do not  have excessive sun exposure. Discussed the need for sun protection: hats, long sleeves and use of sunscreen if there is significant sun exposure.   Diet: the importance of a healthy diet is discussed. They do have a healthy diet.  The benefits of regular aerobic exercise were discussed. She walks 4 times per week ,  20 minutes.   Depression screen: there are no signs or vegative symptoms of depression- irritability, change in appetite, anhedonia, sadness/tearfullness.  Cognitive assessment: the patient manages all their financial and personal affairs and is actively engaged. They could relate day,date,year and events; recalled 2/3 objects at 3 minutes; performed clock-face test normally.  The following portions of the patient's history were reviewed and updated as appropriate: allergies, current medications, past family  history, past medical history,  past surgical history, past social history  and problem list.  Visual acuity was not assessed per patient preference since she has regular follow up with her ophthalmologist. Hearing and body mass index were assessed and reviewed.   During the course of the visit the patient was educated and counseled about appropriate screening and preventive services including : fall prevention , diabetes screening, nutrition counseling, colorectal cancer screening, and recommended immunizations.    CC: The primary encounter diagnosis was Need for Tdap vaccination. Diagnoses of Encounter for preventive health examination and Obesity (BMI 30.0-34.9) were also pertinent to this visit.  Left shoulder injury due to a small ligament tear after falling off a sled (ER visit,  Then ortho)  [facility]  . 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 Had mri brain,  Then cervical spine MRI done  And sent to [facility].  Remote history of left arm numbness treated with PT to resolution  With dry needling,  Stretching etc.    Current  MRI spine reviewed   S/p vasectomy  No complications  2nd marriage fine,  Sleeping well  Had COVID last year  Some visitation/custody issues  With ex wife Perry Morris  That occur  35 yr old Son in counselling ,  History of developmental delay,  Newly diagnosed of Intellectually disabled   Has been in speech therapy since 1,  Now gets Perry Morris services  Still having hemorrhoid issues .  Intermittent , improved with high fiber.  DID NOT  GO TO GI REFERRAL   Using optavia diet to lose weight (calorie restricted to 800 calories ) still working out , has lost 30 lbs so far,  Goal is 200 lbs     History Perry Morris has a past medical history of GERD (gastroesophageal reflux disease) and Headache.   He has a past surgical history that includes Wisdom tooth extraction and ORIF ankle fracture (Left, 07/13/2016).   His family history includes Cancer (age of  onset: 34) in his brother; Stroke (age of onset: 47) in his father.He reports that he has never smoked. He has never used smokeless tobacco. He reports that he does not drink alcohol and does not use drugs.  Outpatient Medications Prior to Visit  Medication Sig Dispense Refill  . tamsulosin (FLOMAX) 0.4 MG CAPS capsule Take 1 capsule (0.4 mg total) by mouth daily. (Patient not taking: Reported on 12/30/2020) 30 capsule 0   No facility-administered medications prior to visit.    Review of Systems   Patient denies headache, fevers, malaise, unintentional weight loss, skin rash, eye pain, sinus congestion and sinus pain, sore throat, dysphagia,  hemoptysis , cough, dyspnea, wheezing, chest pain, palpitations, orthopnea, edema, abdominal pain, nausea, melena, diarrhea, constipation, flank pain, dysuria, hematuria, urinary  Frequency, nocturia, numbness, tingling, seizures,  Focal weakness, Loss of consciousness,  Tremor, insomnia, depression, anxiety, and suicidal ideation.     Objective:  BP 112/64 (BP Location: Left Arm, Patient Position: Sitting, Cuff Size: Large)   Pulse 62   Temp 98.5 F (36.9 C) (Oral)   Resp 15   Ht 5\' 7"  (1.702 m)   Wt 232 lb 3.2 oz (105.3 kg)   SpO2 98%   BMI 36.37 kg/m   Physical Exam  General appearance: alert, cooperative and appears stated age Ears: normal TM's and external ear canals both ears Throat: lips, mucosa, and tongue normal; teeth and gums normal Neck: no adenopathy, no carotid bruit, supple, symmetrical, trachea midline and thyroid not enlarged, symmetric, no tenderness/mass/nodules Back: symmetric, no curvature. ROM normal. No CVA tenderness. Lungs: clear to auscultation bilaterally Heart: regular rate and rhythm, S1, S2 normal, no murmur, click, rub or gallop Abdomen: soft, non-tender; bowel sounds normal; no masses,  no organomegaly Pulses: 2+ and symmetric Skin: Skin color, texture, turgor normal. No rashes or lesions Lymph nodes:  Cervical, supraclavicular, and axillary nodes normal.  Assessment & Plan:   Problem List Items Addressed This Visit      Unprioritized   Encounter for preventive health examination    age appropriate education and counseling updated, referrals for preventative services and immunizations addressed, dietary and smoking counseling addressed, most recent labs reviewed.  I have personally reviewed and have noted:  1) the patient's medical and social history 2) The pt's use of alcohol, tobacco, and illicit drugs 3) The patient's current medications and supplements 4) Functional ability including ADL's, fall risk, home safety risk, hearing and visual impairment 5) Diet and physical activities 6) Evidence for depression or mood disorder 7) The patient's height, weight, and BMI have been recorded in the chart  I have made referrals, and provided counseling and education based on review of the above      Obesity (BMI 30.0-34.9)    Using the Optavia diet with good results thus far   He is heavily muscled,  Which raises his BMI       Other Visit Diagnoses    Need for Tdap vaccination    -  Primary   Relevant Orders  Tdap vaccine greater than or equal to 7yo IM (Completed)      I have discontinued Perry Morris "BEN"'s tamsulosin.  No orders of the defined types were placed in this encounter.   Medications Discontinued During This Encounter  Medication Reason  . tamsulosin (FLOMAX) 0.4 MG CAPS capsule     Follow-up: Return in about 1 year (around 12/30/2021).   Sherlene Shams, MD

## 2020-12-30 NOTE — Patient Instructions (Signed)
Your received the TDaP vaccine today  (it's Good for ten years)   Health Maintenance, Male Adopting a healthy lifestyle and getting preventive care are important in promoting health and wellness. Ask your health care provider about:  The right schedule for you to have regular tests and exams.  Things you can do on your own to prevent diseases and keep yourself healthy. What should I know about diet, weight, and exercise? Eat a healthy diet  Eat a diet that includes plenty of vegetables, fruits, low-fat dairy products, and lean protein.  Do not eat a lot of foods that are high in solid fats, added sugars, or sodium.   Maintain a healthy weight Body mass index (BMI) is a measurement that can be used to identify possible weight problems. It estimates body fat based on height and weight. Your health care provider can help determine your BMI and help you achieve or maintain a healthy weight. Get regular exercise Get regular exercise. This is one of the most important things you can do for your health. Most adults should:  Exercise for at least 150 minutes each week. The exercise should increase your heart rate and make you sweat (moderate-intensity exercise).  Do strengthening exercises at least twice a week. This is in addition to the moderate-intensity exercise.  Spend less time sitting. Even light physical activity can be beneficial. Watch cholesterol and blood lipids Have your blood tested for lipids and cholesterol at 37 years of age, then have this test every 5 years. You may need to have your cholesterol levels checked more often if:  Your lipid or cholesterol levels are high.  You are older than 37 years of age.  You are at high risk for heart disease. What should I know about cancer screening? Many types of cancers can be detected early and may often be prevented. Depending on your health history and family history, you may need to have cancer screening at various ages. This  may include screening for:  Colorectal cancer.  Prostate cancer.  Skin cancer.  Lung cancer. What should I know about heart disease, diabetes, and high blood pressure? Blood pressure and heart disease  High blood pressure causes heart disease and increases the risk of stroke. This is more likely to develop in people who have high blood pressure readings, are of African descent, or are overweight.  Talk with your health care provider about your target blood pressure readings.  Have your blood pressure checked: ? Every 3-5 years if you are 37-9 years of age. ? Every year if you are 37 years old or older.  If you are between the ages of 27 and 76 and are a current or former smoker, ask your health care provider if you should have a one-time screening for abdominal aortic aneurysm (AAA). Diabetes Have regular diabetes screenings. This checks your fasting blood sugar level. Have the screening done:  Once every three years after age 37 if you are at a normal weight and have a low risk for diabetes.  More often and at a younger age if you are overweight or have a high risk for diabetes. What should I know about preventing infection? Hepatitis B If you have a higher risk for hepatitis B, you should be screened for this virus. Talk with your health care provider to find out if you are at risk for hepatitis B infection. Hepatitis C Blood testing is recommended for:  Everyone born from [birth year] through 1967.  Anyone with known risk  factors for hepatitis C. Sexually transmitted infections (STIs)  You should be screened each year for STIs, including gonorrhea and chlamydia, if: ? You are sexually active and are younger than 37 years of age. ? You are older than 37 years of age and your health care provider tells you that you are at risk for this type of infection. ? Your sexual activity has changed since you were last screened, and you are at increased risk for chlamydia or gonorrhea. Ask  your health care provider if you are at risk.  Ask your health care provider about whether you are at high risk for HIV. Your health care provider may recommend a prescription medicine to help prevent HIV infection. If you choose to take medicine to prevent HIV, you should first get tested for HIV. You should then be tested every 3 months for as long as you are taking the medicine. Follow these instructions at home: Lifestyle  Do not use any products that contain nicotine or tobacco, such as cigarettes, e-cigarettes, and chewing tobacco. If you need help quitting, ask your health care provider.  Do not use street drugs.  Do not share needles.  Ask your health care provider for help if you need support or information about quitting drugs. Alcohol use  Do not drink alcohol if your health care provider tells you not to drink.  If you drink alcohol: ? Limit how much you have to 0-2 drinks a day. ? Be aware of how much alcohol is in your drink. In the [place] one drink equals one 12 oz bottle of beer (355 mL), one 5 oz glass of wine (148 mL), or one 1 oz glass of hard liquor (44 mL). General instructions  Schedule regular health, dental, and eye exams.  Stay current with your vaccines.  Tell your health care provider if: ? You often feel depressed. ? You have ever been abused or do not feel safe at home. Summary  Adopting a healthy lifestyle and getting preventive care are important in promoting health and wellness.  Follow your health care provider's instructions about healthy diet, exercising, and getting tested or screened for diseases.  Follow your health care provider's instructions on monitoring your cholesterol and blood pressure. This information is not intended to replace advice given to you by your health care provider. Make sure you discuss any questions you have with your health care provider. Document Revised: 10/23/2018 Document Reviewed: 10/23/2018 Elsevier Patient  Education  2021 [employer].

## 2020-12-30 NOTE — Assessment & Plan Note (Signed)

## 2021-01-01 NOTE — Assessment & Plan Note (Addendum)
Using the [employer] with good results thus far   He is heavily muscled,  Which raises his BMI

## 2021-03-21 ENCOUNTER — Encounter: Payer: Self-pay | Admitting: Adult Health

## 2021-03-21 ENCOUNTER — Other Ambulatory Visit: Payer: Self-pay

## 2021-03-21 ENCOUNTER — Ambulatory Visit (INDEPENDENT_AMBULATORY_CARE_PROVIDER_SITE_OTHER): Payer: Managed Care, Other (non HMO) | Admitting: Adult Health

## 2021-03-21 VITALS — BP 108/64 | HR 61 | Temp 97.6°F | Ht 67.01 in | Wt 224.2 lb

## 2021-03-21 DIAGNOSIS — Z202 Contact with and (suspected) exposure to infections with a predominantly sexual mode of transmission: Secondary | ICD-10-CM | POA: Diagnosis not present

## 2021-03-21 HISTORY — DX: Contact with and (suspected) exposure to infections with a predominantly sexual mode of transmission: Z20.2

## 2021-03-21 MED ORDER — METRONIDAZOLE 500 MG PO TABS: 2000.0000 mg | ORAL_TABLET | Freq: Once | ORAL | 0 refills | Status: AC

## 2021-03-21 NOTE — Progress Notes (Signed)
Acute Office Visit  Subjective:    Patient ID: Perry Morris, male    DOB: Sep 18, 1986, 35 y.o.   MRN: [id number]  Chief Complaint  Patient presents with  . Exposure to STD    Pt is here to test for Trich    HPI Patient is in today for exposure to trichomonas, his wife tested positive last week.  She is being treated.  He is here for treatment due to exposure and also for STD testing.   Patient  denies any fever, body aches,chills, rash, chest pain, shortness of breath, nausea, vomiting, or diarrhea. Denies any symptoms.  Denies dizziness, lightheadedness, pre syncopal or syncopal episodes.    Past Medical History:  Diagnosis Date  . GERD (gastroesophageal reflux disease)    rare-no meds  . Headache    h/o migraines    Past Surgical History:  Procedure Laterality Date  . ORIF ANKLE FRACTURE Left 07/13/2016   Procedure: OPEN REDUCTION INTERNAL FIXATION (ORIF) ANKLE FRACTURE;  Surgeon: Juanell Fairly, MD;  Location: [facility] ORS;  Service: Orthopedics;  Laterality: Left;  . WISDOM TOOTH EXTRACTION      Family History  Problem Relation Age of Onset  . Stroke Father 56       thrombotic,  carotid artery   . Cancer Brother 43       lymphoma    Social History   Socioeconomic History  . Marital status: Divorced    Spouse name: Not on file  . Number of children: Not on file  . Years of education: Not on file  . Highest education level: Not on file  Occupational History  . Not on file  Tobacco Use  . Smoking status: Never Smoker  . Smokeless tobacco: Never Used  Substance and Sexual Activity  . Alcohol use: No  . Drug use: No  . Sexual activity: Not on file  Other Topics Concern  . Not on file  Social History Narrative      He has been divorced  due to his wife's drug abuse.  He has been awarded custody of his two children ages 50 and 51.  He was tested for STDS in December after his divorce finalized.    Social Determinants of Health   Financial Resource  Strain: Not on file  Food Insecurity: Not on file  Transportation Needs: Not on file  Physical Activity: Not on file  Stress: Not on file  Social Connections: Not on file  Intimate Partner Violence: Not on file    No outpatient medications prior to visit.   No facility-administered medications prior to visit.    No Known Allergies  Review of Systems  Constitutional: Negative.   Respiratory: Negative.   Cardiovascular: Negative.   Genitourinary: Negative.   Musculoskeletal: Negative.        Objective:    Physical Exam Vitals reviewed.  Constitutional:      General: He is not in acute distress.    Appearance: He is not ill-appearing, toxic-appearing or diaphoretic.  HENT:     Head: Normocephalic and atraumatic.     Right Ear: External ear normal.     Left Ear: External ear normal.     Mouth/Throat:     Pharynx: Oropharynx is clear.  Eyes:     Pupils: Pupils are equal, round, and reactive to light.  Cardiovascular:     Rate and Rhythm: Normal rate and regular rhythm.     Pulses: Normal pulses.     Heart sounds: Normal  heart sounds.  Pulmonary:     Effort: Pulmonary effort is normal.     Breath sounds: Normal breath sounds.  Abdominal:     General: There is no distension.     Tenderness: There is no abdominal tenderness.  Musculoskeletal:        General: Normal range of motion.     Cervical back: Normal range of motion and neck supple.  Skin:    General: Skin is warm and dry.     Findings: No erythema, lesion or rash.  Neurological:     Mental Status: He is oriented to person, place, and time.  Psychiatric:        Mood and Affect: Mood normal.        Behavior: Behavior normal.        Thought Content: Thought content normal.        Judgment: Judgment normal.     BP 108/64 (BP Location: Left Arm, Patient Position: Sitting)   Pulse 61   Temp 97.6 F (36.4 C)   Ht 5' 7.01" (1.702 m)   Wt 224 lb 3.2 oz (101.7 kg)   SpO2 97%   BMI 35.11 kg/m  Wt  Readings from Last 3 Encounters:  03/21/21 224 lb 3.2 oz (101.7 kg)  12/30/20 232 lb 3.2 oz (105.3 kg)  11/28/20 228 lb (103.4 kg)    Health Maintenance Due  Topic Date Due  . COVID-19 Vaccine (3 - Pfizer risk 4-dose series) 04/27/2020    There are no preventive care reminders to display for this patient.   Lab Results  Component Value Date   TSH 1.74 06/25/2020   Lab Results  Component Value Date   WBC 6.7 05/27/2020   HGB 15.8 05/27/2020   HCT 46.2 05/27/2020   MCV 88.2 05/27/2020   PLT 249.0 05/27/2020   Lab Results  Component Value Date   NA 137 05/27/2020   K 4.7 05/27/2020   CO2 29 05/27/2020   GLUCOSE 91 05/27/2020   BUN 20 05/27/2020   CREATININE 1.17 05/27/2020   BILITOT 0.7 05/27/2020   ALKPHOS 52 05/27/2020   AST 19 05/27/2020   ALT 23 05/27/2020   PROT 7.2 05/27/2020   ALBUMIN 4.9 05/27/2020   CALCIUM 9.8 05/27/2020   ANIONGAP 5 07/12/2016   GFR 70.63 05/27/2020   Lab Results  Component Value Date   CHOL 143 06/05/2019   Lab Results  Component Value Date   HDL 45.50 06/05/2019   Lab Results  Component Value Date   LDLCALC 85 06/05/2019   Lab Results  Component Value Date   TRIG 64.0 06/05/2019   Lab Results  Component Value Date   CHOLHDL 3 06/05/2019   Lab Results  Component Value Date   HGBA1C 5.4 06/25/2020       Assessment & Plan:   Problem List Items Addressed This Visit      Other   Exposure to trichomonas - Primary   Relevant Medications   metroNIDAZOLE (FLAGYL) 500 MG tablet   Other Relevant Orders   Chlamydia/Gonococcus/Trichomonas, NAA      treatment as advised. No alcohol with treatment and no intercourse advised for at least 10 days after both partners treated. Test of cure is advised in 3 months if test is positive.  Patient declines any other blood testing for sexually transmitted diseases.   Meds ordered this encounter  Medications  . metroNIDAZOLE (FLAGYL) 500 MG tablet    Sig: Take 4 tablets (2,000 mg  total) by mouth  once for 1 dose.    Dispense:  4 tablet    Refill:  0   Return in about 3 months (around 06/21/2021), or if symptoms worsen or fail to improve, for at any time for any worsening symptoms, Go to Emergency room/ urgent care if worse. Return precautions given. Red Flags discussed. The patient was given clear instructions to go to ER or return to medical center if any red flags develop, symptoms do not improve, worsen or new problems develop. They verbalized understanding.    Risks, benefits, and alternatives of the medications and treatment plan prescribed today were discussed, and patient expressed understanding.    Education regarding symptom management and diagnosis given to patient on AVS.  Patient was in agreement with treatment plan.   Continue to follow with  Eula Fried. Jonae Renshaw AGNP-C, FNP-C for routine health maintenance.   Eula Fried. Shantrell Placzek AGNP-C, FNP-C  Perry Ben, FNP

## 2021-03-21 NOTE — Patient Instructions (Signed)
Trichomonas Test Why am I having this test? The trichomonas test is done to diagnose trichomoniasis, a sexually transmitted infection (STI). You may have this test as a part of a routine screening for STIs or if you have symptoms of trichomoniasis such as:  Profuse discharge from the vagina or penis.  Genital itching and discomfort.  Difficulty urinating.  Men may have burning after urination or ejaculation. What is being tested? This test checks your urine or discharge sample for Trichomonas vaginalis, the parasite that causes this infection. What kind of sample is taken? To perform the test, your health care provider will ask you to either provide a urine sample or take a sample of discharge. The sample will be taken from the vagina or cervix in women and from the urethra in men. Your health care provider may use a swab to collect the sample. How are the results reported? Your test results will be reported as either positive or negative. It is up to you to get your test results. Ask your health care provider, or the department that is doing the test, when your results will be ready. What do the results mean? Negative test result A negative result means that you do not have trichomoniasis. Follow your health care provider's directions about any follow-up testing. Positive test result A positive result means that you have an active infection that needs to be treated with antimicrobial medicine. All your current sexual partners must also be treated. If they are not, you will likely get re-infected. If your test result is positive, your health care provider will start you on medicine and may advise you:  Not to have sex until your infection has cleared up.  To use a latex condom properly every time you have sex.  To limit the number of sexual partners you have. The more partners you have, the greater your risk of contracting trichomoniasis or another STI.  To tell all sexual partners  about your infection so that they can also get tested and treated. This will prevent reinfection. Talk with your health care provider to discuss your results, treatment options, and if necessary, the need for more tests. Talk with your health care provider if you have any questions about your results. Questions to ask your health care provider Ask your health care provider or the department that is doing the test:  When will my results be ready?  How will I get my results?  What are my treatment options?  What other tests do I need?  What are my next steps? Summary  The trichomonas test is done to diagnose trichomoniasis, a sexually transmitted infection (STI) caused by an organism called Trichomonas.  To perform the test, your health care provider will ask you to either provide a urine sample or take a sample of discharge.  A positive result means that you have an active infection that needs to be treated with antimicrobial medicine. All your current sexual partners must also be treated. If they are not, you will likely get re-infected. This information is not intended to replace advice given to you by your health care provider. Make sure you discuss any questions you have with your health care provider. Document Revised: 09/09/2020 Document Reviewed: 09/09/2020 Elsevier Patient Education  2021 Elsevier Inc. Metronidazole tablets or capsules What is this medicine? METRONIDAZOLE (me troe NI da zole) is an antiinfective. It is used to treat certain kinds of bacterial and protozoal infections. It will not work for colds, flu, or other  viral infections. This medicine may be used for other purposes; ask your health care provider or pharmacist if you have questions. COMMON BRAND NAME(S): Flagyl What should I tell my health care provider before I take this medicine? They need to know if you have any of these conditions:  Cockayne syndrome  history of blood diseases such as sickle cell  anemia, anemia, or leukemia  if you often drink alcohol  irregular heartbeat or rhythm  kidney disease  liver disease  yeast or fungal infection  an unusual or allergic reaction to metronidazole, nitroimidazoles, or other medicines, foods, dyes, or preservatives  pregnant or trying to get pregnant  breast-feeding How should I use this medicine? Take this medicine by mouth with water. Take it as directed on the prescription label at the same time every day. Take all of this medicine unless your health care provider tells you to stop it early. Keep taking it even if you think you are better. Talk to your health care provider about the use of this medicine in children. While it may be prescribed for children for selected conditions, precautions do apply. Overdosage: If you think you have taken too much of this medicine contact a poison control center or emergency room at once. NOTE: This medicine is only for you. Do not share this medicine with others. What if I miss a dose? If you miss a dose, take it as soon as you can. If it is almost time for your next dose, take only that dose. Do not take double or extra doses. What may interact with this medicine? Do not take this medicine with any of the following medications:  alcohol or any product that contains alcohol  cisapride  disulfiram  dronedarone  pimozide  thioridazine This medicine may also interact with the following medications:  birth control pills  busulfan  carbamazepine  certain medicines that treat or prevent blood clots like warfarin  cimetidine  lithium  other medicines that prolong the QT interval (cause an abnormal heart rhythm)  phenobarbital  phenytoin This list may not describe all possible interactions. Give your health care provider a list of all the medicines, herbs, non-prescription drugs, or dietary supplements you use. Also tell them if you smoke, drink alcohol, or use illegal drugs. Some  items may interact with your medicine. What should I watch for while using this medicine? Tell your health care provider if your symptoms do not start to get better or if they get worse. Some products may contain alcohol. Ask your health care provider if this medicine contains alcohol. Be sure to tell all health care providers you are taking this medicine. Certain medicines, such as metronidazole and disulfiram, can cause an unpleasant reaction when taken with alcohol. The reaction includes flushing, headache, nausea, vomiting, sweating, and increased thirst. The reaction can last from 30 minutes to several hours. If you are being treated for a sexually transmitted disease (STD), avoid sexual contact until you have finished your treatment. Your sexual partner may also need treatment. Birth control may not work properly while you are taking this medicine. Talk to your health care provider about using an extra method of birth control. What side effects may I notice from receiving this medicine? Side effects that you should report to your doctor or health care professional as soon as possible:  allergic reactions like skin rash, itching or hives, swelling of the face, lips, or tongue  confusion  fast, irregular heartbeat  light-colored stool  liver injury (dark  yellow or brown urine; general ill feeling or flu-like symptoms; loss of appetite, right upper belly pain; unusually weak or tired, yellowing of the eyes or skin)  pain, tingling, numbness in the hands or feet  rash, fever, and swollen lymph nodes  redness, blistering, peeling or loosening of the skin, including inside the mouth  seizures  unusual vaginal discharge, itching, or odor Side effects that usually do not require medical attention (report to your doctor or health care professional if they continue or are bothersome):  change in taste  diarrhea  headache  nausea  stomach pain  vomiting This list may not describe  all possible side effects. Call your doctor for medical advice about side effects. You may report side effects to FDA at 1-800-FDA-1088. Where should I keep my medicine? Keep out of the reach of children and pets. Store between 15 and 25 degrees C (59 and 77 degrees F). Protect from light. Get rid of any unused medicine after the expiration date. To get rid of medicines that are no longer needed or have expired:  Take the medicine to a medicine take-back program. Check with your pharmacy or law enforcement to find a location.  If you cannot return the medicine, check the label or package insert to see if the medicine should be thrown out in the garbage or flushed down the toilet. If you are not sure, ask your health care provider. If it is safe to put it in the trash, take the medicine out of the container. Mix the medicine with cat litter, dirt, coffee grounds, or other unwanted substance. Seal the mixture in a bag or container. Put it in the trash. NOTE: This sheet is a summary. It may not cover all possible information. If you have questions about this medicine, talk to your doctor, pharmacist, or health care provider.  2021 Elsevier/Gold Standard (2020-02-03 09:24:11)

## 2021-03-23 LAB — CHLAMYDIA/GONOCOCCUS/TRICHOMONAS, NAA
Chlamydia by NAA: NEGATIVE
Gonococcus by NAA: NEGATIVE
Trich vag by NAA: NEGATIVE

## 2021-03-23 NOTE — Progress Notes (Signed)
Negative Chlamydia, gonorrhea and trichomonas by swab. Still recommend taking treatment for Trichomonas since partner is positive.

## 2021-06-23 ENCOUNTER — Other Ambulatory Visit: Payer: Self-pay

## 2021-06-23 ENCOUNTER — Ambulatory Visit (INDEPENDENT_AMBULATORY_CARE_PROVIDER_SITE_OTHER): Payer: Managed Care, Other (non HMO) | Admitting: Internal Medicine

## 2021-06-23 ENCOUNTER — Encounter: Payer: Self-pay | Admitting: Internal Medicine

## 2021-06-23 VITALS — BP 116/68 | HR 59 | Temp 96.8°F | Resp 14 | Ht 67.0 in | Wt 231.0 lb

## 2021-06-23 DIAGNOSIS — Z202 Contact with and (suspected) exposure to infections with a predominantly sexual mode of transmission: Secondary | ICD-10-CM

## 2021-06-23 DIAGNOSIS — E782 Mixed hyperlipidemia: Secondary | ICD-10-CM

## 2021-06-23 DIAGNOSIS — R5383 Other fatigue: Secondary | ICD-10-CM | POA: Diagnosis not present

## 2021-06-23 DIAGNOSIS — R7301 Impaired fasting glucose: Secondary | ICD-10-CM

## 2021-06-23 DIAGNOSIS — R350 Frequency of micturition: Secondary | ICD-10-CM

## 2021-06-23 DIAGNOSIS — E669 Obesity, unspecified: Secondary | ICD-10-CM

## 2021-06-23 DIAGNOSIS — Z23 Encounter for immunization: Secondary | ICD-10-CM

## 2021-06-23 LAB — URINALYSIS, ROUTINE W REFLEX MICROSCOPIC
Bilirubin Urine: NEGATIVE
Hgb urine dipstick: NEGATIVE
Ketones, ur: NEGATIVE
Leukocytes,Ua: NEGATIVE
Nitrite: NEGATIVE
RBC / HPF: NONE SEEN (ref 0–?)
Specific Gravity, Urine: 1.015 (ref 1.000–1.030)
Total Protein, Urine: NEGATIVE
Urine Glucose: NEGATIVE
Urobilinogen, UA: 0.2 (ref 0.0–1.0)
pH: 7 (ref 5.0–8.0)

## 2021-06-23 LAB — COMPREHENSIVE METABOLIC PANEL
ALT: 26 U/L (ref 0–53)
AST: 27 U/L (ref 0–37)
Albumin: 4.6 g/dL (ref 3.5–5.2)
Alkaline Phosphatase: 65 U/L (ref 39–117)
BUN: 20 mg/dL (ref 6–23)
CO2: 26 mEq/L (ref 19–32)
Calcium: 9.6 mg/dL (ref 8.4–10.5)
Chloride: 101 mEq/L (ref 96–112)
Creatinine, Ser: 1.22 mg/dL (ref 0.40–1.50)
GFR: 76.09 mL/min (ref >60.00)
Glucose, Bld: 81 mg/dL (ref 70–99)
Potassium: 4.2 mEq/L (ref 3.5–5.1)
Sodium: 136 mEq/L (ref 135–145)
Total Bilirubin: 0.7 mg/dL (ref 0.2–1.2)
Total Protein: 7.4 g/dL (ref 6.0–8.3)

## 2021-06-23 LAB — LIPID PANEL
Cholesterol: 170 mg/dL (ref 0–200)
HDL: 67.4 mg/dL (ref >39.00)
LDL Cholesterol: 92 mg/dL (ref 0–99)
NonHDL: 102.73
Total CHOL/HDL Ratio: 3
Triglycerides: 52 mg/dL (ref 0.0–149.0)
VLDL: 10.4 mg/dL (ref 0.0–40.0)

## 2021-06-23 LAB — HEMOGLOBIN A1C: Hgb A1c MFr Bld: 5.2 % (ref 4.6–6.5)

## 2021-06-23 LAB — TSH: TSH: 2 u[IU]/mL (ref 0.35–5.50)

## 2021-06-23 NOTE — Progress Notes (Signed)
Patient ID: Perry Morris, male    DOB: 1985-12-25  Age: 35 y.o. MRN: [id number]  Depression screen: there are no signs or vegative symptoms of depression- irritability, change in appetite, anhedonia, sadness/tearfullness. CC: The primary encounter diagnosis was COVID-19 vaccine administered. Diagnoses of Frequent urination, Fatigue, unspecified type, Moderate mixed hyperlipidemia not requiring statin therapy, Impaired fasting glucose, Exposure to trichomonas, and Obesity (BMI 30.0-34.9) were also pertinent to this visit.   STD EXPOSURE;  tested in May after wife tested positive for Trichomonas .  G/C/Trich urethral swab was  negative . Wife's test was deemed a FALSE POSITIVE.  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 His zeal for exercise has created conflict with wife;  as she has complained that it makes her  uncomfortable about her own body .  He has a history of intentional weight loss of  40 lbs on optavia diet , which he started it in October .  He  weight lifts and takes creatine  4 days per week.  Fasts one day per week,  liberalizes diet on weekends.    Frequent urination:  he attributes it to water intake of 70 to 100 fluid ounces of water daily    History Thaine has a past medical history of GERD (gastroesophageal reflux disease) and Headache.   He has a past surgical history that includes Wisdom tooth extraction and ORIF ankle fracture (Left, 07/13/2016).   His family history includes Cancer (age of onset: 30) in his brother; Stroke (age of onset: 34) in his father.He reports that he has never smoked. He has never used smokeless tobacco. He reports that he does not drink alcohol and does not use drugs.  No outpatient medications prior to visit.   No facility-administered medications prior to visit.    Review of Systems  Patient denies headache, fevers, malaise, unintentional weight loss, skin rash, eye pain, sinus congestion and sinus pain, sore throat, dysphagia,   hemoptysis , cough, dyspnea, wheezing, chest pain, palpitations, orthopnea, edema, abdominal pain, nausea, melena, diarrhea, constipation, flank pain, dysuria, hematuria, urinary  Frequency, nocturia, numbness, tingling, seizures,  Focal weakness, Loss of consciousness,  Tremor, insomnia, depression, anxiety, and suicidal ideation.     Objective:  BP 116/68 (BP Location: Left Arm, Patient Position: Sitting, Cuff Size: Large)   Pulse (!) 59   Temp (!) 96.8 F (36 C) (Temporal)   Resp 14   Ht 5\' 7"  (1.702 m)   Wt 231 lb (104.8 kg)   SpO2 98%   BMI 36.18 kg/m   Physical Exam  General appearance: alert, cooperative and appears stated age. Very muscular build  Ears: normal TM's and external ear canals both ears Throat: lips, mucosa, and tongue normal; teeth and gums normal Neck: no adenopathy, no carotid bruit, supple, symmetrical, trachea midline and thyroid not enlarged, symmetric, no tenderness/mass/nodules Back: symmetric, no curvature. ROM normal. No CVA tenderness. Lungs: clear to auscultation bilaterally Heart: regular rate and rhythm, S1, S2 normal, no murmur, click, rub or gallop Abdomen: soft, non-tender; bowel sounds normal; no masses,  no organomegaly Pulses: 2+ and symmetric Skin: Skin color, texture, turgor normal. No rashes or lesions Lymph nodes: Cervical, supraclavicular, and axillary nodes normal.    Assessment & Plan:   Problem List Items Addressed This Visit       Unprioritized   Obesity (BMI 30.0-34.9)    BMI is inaccurate due to excessive muscle mass. Screening for hyperlipidemia and diabetes is negative  Exposure to trichomonas    Testing was negative, but empiric treatment with a one time dose of metronidazole was recommended at time of previous evaluation . Marland Kitchen  Neither he nor his wife have had extramarital sexual contact; her test has been deemed a false negative      Frequent urination    Will treat for prostatitis given chronic mild pyuria (noted  previously as well ) with septra DS x 2 weeks.  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       Relevant Orders   Urinalysis, Routine w reflex microscopic (Completed)   Other Visit Diagnoses     COVID-19 vaccine administered    -  Primary   Relevant Orders   SARS-CoV-2 Semi-Quantitative Total Antibody, Spike   Fatigue, unspecified type       Relevant Orders   Comprehensive metabolic panel (Completed)   TSH (Completed)   Moderate mixed hyperlipidemia not requiring statin therapy       Relevant Orders   Lipid panel (Completed)   Impaired fasting glucose       Relevant Orders   Hemoglobin A1c (Completed)      I provided  30 minutes  Was spent  reviewing patient's current problems and past encounters with his specialists, reviewing and ordering  labs and imaging studies, providing counseling on the above mentioned problems , and evaluating patient  In a face to face visit  .   I am having Perry Morris "BEN" start on sulfamethoxazole-trimethoprim.  Meds ordered this encounter  Medications   sulfamethoxazole-trimethoprim (BACTRIM DS) 800-160 MG tablet    Sig: Take 1 tablet by mouth 2 (two) times daily.    Dispense:  28 tablet    Refill:  0     There are no discontinued medications.  Follow-up: No follow-ups on file.   Sherlene Shams, MD

## 2021-06-25 DIAGNOSIS — R350 Frequency of micturition: Secondary | ICD-10-CM | POA: Insufficient documentation

## 2021-06-25 MED ORDER — SULFAMETHOXAZOLE-TRIMETHOPRIM 800-160 MG PO TABS: 1.0000 | ORAL_TABLET | Freq: Two times a day (BID) | ORAL | 0 refills | Status: DC

## 2021-06-25 NOTE — Assessment & Plan Note (Addendum)
Testing was negative, but empiric treatment with a one time dose of metronidazole was recommended at time of previous evaluation . Marland Kitchen  Neither he nor his wife have had extramarital sexual contact; her test has been deemed a false negative

## 2021-06-25 NOTE — Assessment & Plan Note (Signed)
BMI is inaccurate due to excessive muscle mass. Screening for hyperlipidemia and diabetes is negative

## 2021-06-25 NOTE — Assessment & Plan Note (Addendum)
Will treat for prostatitis given chronic mild pyuria (noted previously as well ) with septra DS x 2 weeks.  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

## 2021-06-26 LAB — SARS-COV-2 SEMI-QUANTITATIVE TOTAL ANTIBODY, SPIKE: SARS COV2 AB, Total Spike Semi QN: 1240 U/mL — ABNORMAL HIGH (ref <0.8)

## 2021-06-27 ENCOUNTER — Telehealth: Payer: Self-pay

## 2021-06-27 NOTE — Telephone Encounter (Signed)
LMTCB in regards to lab results.  

## 2021-07-11 ENCOUNTER — Other Ambulatory Visit: Payer: Self-pay | Admitting: *Deleted

## 2021-07-11 DIAGNOSIS — R350 Frequency of micturition: Secondary | ICD-10-CM

## 2021-07-12 ENCOUNTER — Other Ambulatory Visit: Payer: Managed Care, Other (non HMO)

## 2021-11-21 DIAGNOSIS — H9071 Mixed conductive and sensorineural hearing loss, unilateral, right ear, with unrestricted hearing on the contralateral side: Secondary | ICD-10-CM | POA: Diagnosis not present

## 2021-11-21 DIAGNOSIS — R430 Anosmia: Secondary | ICD-10-CM | POA: Diagnosis not present

## 2022-01-02 ENCOUNTER — Encounter: Payer: Managed Care, Other (non HMO) | Admitting: Internal Medicine

## 2022-01-19 ENCOUNTER — Encounter: Payer: Self-pay | Admitting: Internal Medicine

## 2022-01-20 ENCOUNTER — Encounter: Payer: Self-pay | Admitting: Internal Medicine

## 2022-01-20 ENCOUNTER — Ambulatory Visit (INDEPENDENT_AMBULATORY_CARE_PROVIDER_SITE_OTHER): Payer: BC Managed Care – PPO | Admitting: Internal Medicine

## 2022-01-20 ENCOUNTER — Other Ambulatory Visit: Payer: Self-pay

## 2022-01-20 VITALS — BP 116/72 | HR 55 | Temp 98.5°F | Ht 67.0 in | Wt 235.6 lb

## 2022-01-20 DIAGNOSIS — R7301 Impaired fasting glucose: Secondary | ICD-10-CM | POA: Diagnosis not present

## 2022-01-20 DIAGNOSIS — E782 Mixed hyperlipidemia: Secondary | ICD-10-CM | POA: Diagnosis not present

## 2022-01-20 DIAGNOSIS — Z0000 Encounter for general adult medical examination without abnormal findings: Secondary | ICD-10-CM

## 2022-01-20 DIAGNOSIS — R5383 Other fatigue: Secondary | ICD-10-CM

## 2022-01-20 DIAGNOSIS — J0100 Acute maxillary sinusitis, unspecified: Secondary | ICD-10-CM

## 2022-01-20 DIAGNOSIS — E66811 Obesity, class 1: Secondary | ICD-10-CM

## 2022-01-20 DIAGNOSIS — H905 Unspecified sensorineural hearing loss: Secondary | ICD-10-CM | POA: Insufficient documentation

## 2022-01-20 DIAGNOSIS — E669 Obesity, unspecified: Secondary | ICD-10-CM

## 2022-01-20 LAB — COMPREHENSIVE METABOLIC PANEL
ALT: 25 U/L (ref 0–53)
AST: 36 U/L (ref 0–37)
Albumin: 4.8 g/dL (ref 3.5–5.2)
Alkaline Phosphatase: 64 U/L (ref 39–117)
BUN: 24 mg/dL — ABNORMAL HIGH (ref 6–23)
CO2: 26 mEq/L (ref 19–32)
Calcium: 9.6 mg/dL (ref 8.4–10.5)
Chloride: 102 mEq/L (ref 96–112)
Creatinine, Ser: 1.22 mg/dL (ref 0.40–1.50)
GFR: 75.78 mL/min (ref >60.00)
Glucose, Bld: 84 mg/dL (ref 70–99)
Potassium: 4.3 mEq/L (ref 3.5–5.1)
Sodium: 137 mEq/L (ref 135–145)
Total Bilirubin: 1.2 mg/dL (ref 0.2–1.2)
Total Protein: 7 g/dL (ref 6.0–8.3)

## 2022-01-20 LAB — CBC WITH DIFFERENTIAL/PLATELET
Basophils Absolute: 0 10*3/uL (ref 0.0–0.1)
Basophils Relative: 0.4 % (ref 0.0–3.0)
Eosinophils Absolute: 0.1 10*3/uL (ref 0.0–0.7)
Eosinophils Relative: 1.4 % (ref 0.0–5.0)
HCT: 44.4 % (ref 39.0–52.0)
Hemoglobin: 14.9 g/dL (ref 13.0–17.0)
Lymphocytes Relative: 23.9 % (ref 12.0–46.0)
Lymphs Abs: 1.6 10*3/uL (ref 0.7–4.0)
MCHC: 33.6 g/dL (ref 30.0–36.0)
MCV: 89.8 fl (ref 78.0–100.0)
Monocytes Absolute: 0.5 10*3/uL (ref 0.1–1.0)
Monocytes Relative: 7.9 % (ref 3.0–12.0)
Neutro Abs: 4.4 10*3/uL (ref 1.4–7.7)
Neutrophils Relative %: 66.4 % (ref 43.0–77.0)
Platelets: 227 10*3/uL (ref 150.0–400.0)
RBC: 4.95 Mil/uL (ref 4.22–5.81)
RDW: 13.5 % (ref 11.5–15.5)
WBC: 6.6 10*3/uL (ref 4.0–10.5)

## 2022-01-20 LAB — HEMOGLOBIN A1C: Hgb A1c MFr Bld: 5.2 % (ref 4.6–6.5)

## 2022-01-20 LAB — TSH: TSH: 1.41 u[IU]/mL (ref 0.35–5.50)

## 2022-01-20 LAB — LIPID PANEL
Cholesterol: 168 mg/dL (ref 0–200)
HDL: 60.9 mg/dL (ref >39.00)
LDL Cholesterol: 98 mg/dL (ref 0–99)
NonHDL: 107
Total CHOL/HDL Ratio: 3
Triglycerides: 44 mg/dL (ref 0.0–149.0)
VLDL: 8.8 mg/dL (ref 0.0–40.0)

## 2022-01-20 MED ORDER — AMOXICILLIN-POT CLAVULANATE 875-125 MG PO TABS: 1.0000 | ORAL_TABLET | Freq: Two times a day (BID) | ORAL | 0 refills | Status: DC

## 2022-01-20 MED ORDER — PREDNISONE 10 MG PO TABS: ORAL_TABLET | ORAL | 0 refills | Status: DC

## 2022-01-20 NOTE — Assessment & Plan Note (Signed)
present for the past 5 days.    Episode of right ear pain now resolved,  Occurred 24 hrs after flying.  rx for augmentin/prednisone given for CONDITIONAL use  ?

## 2022-01-20 NOTE — Assessment & Plan Note (Signed)
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

## 2022-01-20 NOTE — Assessment & Plan Note (Signed)

## 2022-01-20 NOTE — Patient Instructions (Signed)
Start the augmentin/prednisone IF: ? ?1) CURRENT SYMPTOMS DO NOT RESOLVE IN ANOTHER 3 DAYS ? ?2) EARS BECOME PAINFUL ? ?3) SINUS PRESSURE TURNS INTO SINUS PAIN OR YOU START BLOWING OUT BLOOD STREAKED OR GREEN MUCUS ? ?Daily use of a probiotic advised for 3 weeks if you start the antibiotic  ?

## 2022-01-20 NOTE — Progress Notes (Signed)
The patient is here for annual preventive examination and management of other chronic and acute problems. ? ?This visit occurred during the SARS-CoV-2 public health emergency.  Safety protocols were in place, including screening questions prior to the visit, additional usage of staff PPE, and extensive cleaning of exam room while observing appropriate contact time as indicated for disinfecting solutions.  ? ?  ?The risk factors are reflected in the social history. ?  ?The roster of all physicians providing medical care to patient - is listed in the Snapshot section of the chart. ?  ?Activities of daily living:  The patient is 100% independent in all ADLs: dressing, toileting, feeding as well as independent mobility ?  ?Home safety : The patient has smoke detectors in the home. They wear seatbelts.  There are no unsecured firearms at home. There is no violence in the home.  ?  ?There is no risks for hepatitis, STDs or HIV. There is no   history of blood transfusion. They have no travel history to infectious disease endemic areas of the world. ?  ?The patient has seen their dentist in the last six month. They have seen their eye doctor in the last year. He has significant hearing loss in the right ear but denies hearing difficulty with regard to whispered voices and some television programs.  They do not  have excessive sun exposure. Discussed the need for sun protection: hats, long sleeves and use of sunscreen if there is significant sun exposure.  ?  ?Diet: the importance of a healthy diet is discussed. They do have a healthy diet. ?  ?The benefits of regular aerobic exercise were discussed. The patient  exercises  6  days per week  for  60 minutes minimum   ?  ?Depression screen: there are no signs or vegative symptoms of depression- irritability, change in appetite, anhedonia, sadness/tearfullness. ?  ?The following portions of the patient's history were reviewed and updated as appropriate: allergies, current  medications, past family history, past medical history,  past surgical history, past social history  and problem list. ?  ?Visual acuity was not assessed per patient preference since the patient has regular follow up with an  ophthalmologist. Hearing and body mass index were assessed and reviewed.  ?  ?During the course of the visit the patient was educated and counseled about appropriate screening and preventive services including : fall prevention , diabetes screening, nutrition counseling, colorectal cancer screening, and recommended immunizations.   ? ?Chief Complaint: ? ?1) cold symptoms started on Monday:  sore throat,  cough,  right ear hurting  ? ?2) urinary frequency with pyuria treated empirically with septra DS x 2 weeks.  Symptoms resolving  ? ?3) obesity:  lost 40 lbs on [employer] diet last summer  . Has regained 13 lbs since the,  discussed portion reduction  works out "A LOT"  (4-6 days per week) . Feels he is in the best shape he has ever been  but frustrated .  [employer] FOR 8 YEARS.  COMPETES  ?  ? ? ?Review of Symptoms ? ?Patient denies headache, fevers, malaise, unintentional weight loss, skin rash, eye pain, sinus congestion and sinus pain, sore throat, dysphagia,  hemoptysis , cough, dyspnea, wheezing, chest pain, palpitations, orthopnea, edema, abdominal pain, nausea, melena, diarrhea, constipation, flank pain, dysuria, hematuria, urinary  Frequency, nocturia, numbness, tingling, seizures,  Focal weakness, Loss of consciousness,  Tremor, insomnia, depression, anxiety, and suicidal ideation.   ? ?Physical Exam: ? ?BP 116/72 (BP  Location: Left Arm, Patient Position: Sitting, Cuff Size: Large)   Pulse (!) 55   Temp 98.5 ?F (36.9 ?C) (Oral)   Ht 5\' 7"  (1.702 m)   Wt 235 lb 9.6 oz (106.9 kg)   SpO2 99%   BMI 36.90 kg/m?   ? ?General appearance: alert, cooperative and appears stated age.  INCREASED MUSCLE MASS  ?Ears: scarred right TM ,.  Normal left TM, normal  external ear canals both  ears ?Throat: lips, mucosa, and tongue normal; teeth and gums normal ?Neck: no adenopathy, no carotid bruit, supple, symmetrical, trachea midline and thyroid not enlarged, symmetric, no tenderness/mass/nodules ?Back: symmetric, no curvature. ROM normal. No CVA tenderness. ?Lungs: clear to auscultation bilaterally ?Heart: regular rate and rhythm, S1, S2 normal, no murmur, click, rub or gallop ?Abdomen: soft, non-tender; bowel sounds normal; no masses,  no organomegaly ?Pulses: 2+ and symmetric ?Skin: Skin color, texture, turgor normal. No rashes or lesions ?Lymph nodes: Cervical, supraclavicular, and axillary nodes normal.  ? ?Assessment and Plan: ? ?Sinusitis, acute maxillary ?present for the past 5 days.    Episode of right ear pain now resolved,  Occurred 24 hrs after flying.  rx for augmentin/prednisone given for CONDITIONAL use  ? ?Encounter for preventive health examination ?age appropriate education and counseling updated, referrals for preventative services and immunizations addressed, dietary and smoking counseling addressed, most recent labs reviewed.  I have personally reviewed and have noted: ?  ?1) the patient's medical and social history ?2) The pt's use of alcohol, tobacco, and illicit drugs ?3) The patient's current medications and supplements ?4) Functional ability including ADL's, fall risk, home safety risk, hearing and visual impairment ?5) Diet and physical activities ?6) Evidence for depression or mood disorder ?7) The patient's height, weight, and BMI have been recorded in the chart ?  ?I have made referrals, and provided counseling and education based on review of the above ? ? 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 ? ?Updated Medication List ?Outpatient Encounter Medications as of 01/20/2022  ?Medication Sig  ? amoxicillin-clavulanate (AUGMENTIN) 875-125 MG tablet Take 1 tablet by mouth 2  (two) times daily.  ? predniSONE (DELTASONE) 10 MG tablet 6 tablets on Day 1 , then reduce by 1 tablet daily until gone  ? [DISCONTINUED] sulfamethoxazole-trimethoprim (BACTRIM DS) 800-160 MG tablet Take 1 tablet by mouth 2 (two) times daily. (Patient not taking: Reported on 01/19/2022)  ? ?No facility-administered encounter medications on file as of 01/20/2022.  ?  ?

## 2022-01-21 LAB — TESTOSTERONE TOTAL,FREE,BIO, MALES
Albumin: 4.6 g/dL (ref 3.6–5.1)
Sex Hormone Binding: 26 nmol/L (ref 10–50)
Testosterone, Bioavailable: 161.4 ng/dL (ref 110.0–575.0)
Testosterone, Free: 76.9 pg/mL (ref 46.0–224.0)
Testosterone: 477 ng/dL (ref 250–827)

## 2022-03-14 ENCOUNTER — Telehealth (INDEPENDENT_AMBULATORY_CARE_PROVIDER_SITE_OTHER): Payer: BC Managed Care – PPO | Admitting: Internal Medicine

## 2022-03-14 ENCOUNTER — Encounter: Payer: Self-pay | Admitting: Internal Medicine

## 2022-03-14 DIAGNOSIS — E669 Obesity, unspecified: Secondary | ICD-10-CM

## 2022-03-14 DIAGNOSIS — B309 Viral conjunctivitis, unspecified: Secondary | ICD-10-CM | POA: Diagnosis not present

## 2022-03-14 NOTE — Progress Notes (Signed)
Virtual Visit via Caregility Note ? ?This visit type was conducted due to national recommendations for restrictions regarding the COVID-19 pandemic (e.g. social distancing).  This format is felt to be most appropriate for this patient at this time.  All issues noted in this document were discussed and addressed.  No physical exam was performed (except for noted visual exam findings with Video Visits).  ? ?I connected withNAME@ on 03/14/22 at 11:15 AM EDT by a video enabled telemedicine application  and verified that I am speaking with the correct person using two identifiers. ?Location patient: home ?Location provider: work or home office ?Persons participating in [place] appointments. I also discussed with the patient that there may be a patient responsible charge related to this service. The patient expressed understanding and agreed to proceed. ? ? ?Reason for visit: pink eye  ?HPI: ? ?Right scleral injection for the last 4 days,  has not responded to antibiotic ointment he borrowed from his son who had similar symptoms one month ago .  No eye  pain,  no milky discharge,  no fevers .  Some allergy symptoms  taking claritin intermittently . ? ?Wife had pink eye 2 weeks ago.  Was given a different antibiotic than son was given since it didn't work  ? ? ?ROS: See pertinent positives and negatives per HPI. ? ?Past Medical History:  ?Diagnosis Date  ? Exposure to trichomonas 03/21/2021  ? GERD (gastroesophageal reflux disease)   ? rare-no meds  ? Headache   ? h/o migraines  ? ? ?Past Surgical History:  ?Procedure Laterality Date  ? ORIF ANKLE FRACTURE Left 07/13/2016  ? Procedure: OPEN REDUCTION INTERNAL FIXATION (ORIF) ANKLE FRACTURE;  Surgeon: Thornton Park, MD;  Location: [facility] ORS;  Service: Orthopedics;  Laterality: Left;  ? WISDOM TOOTH EXTRACTION    ? ? ?Family History  ?Problem Relation Age of Onset  ? Stroke Father 73  ?     thrombotic,  carotid artery   ? Cancer Brother 43  ?     lymphoma  ? ? ?SOCIAL HX:   reports that he has never smoked. He has never used smokeless tobacco. He reports that he does not drink alcohol and does not use drugs.  ? ?No current outpatient medications on file. ? ?EXAM: ? ?VITALS per patient if applicable: ? ?GENERAL: alert, oriented, appears well and in no acute distress ? ?HEENT: atraumatic, conjunttiva clear, no obvious abnormalities on inspection of external nose and ears ?Right eye injected,  no discharge,  full ROM  ? ?NECK: normal movements of the head and neck ? ?LUNGS: on inspection no signs of respiratory distress, breathing rate appears normal, no obvious gross SOB, gasping or wheezing ? ?CV: no obvious cyanosis ? ?MS: moves all visible extremities without noticeable abnormality ? ?PSYCH/NEURO: pleasant and cooperative, no obvious depression or anxiety, speech and thought processing grossly intact ? ?ASSESSMENT AND PLAN: ? ?Discussed the following assessment and plan: ? ?Acute viral conjunctivitis of right eye ? ?Obesity (BMI 30.0-34.9) ? ?Acute viral conjunctivitis of right eye ?Likely due to allergies vs viral syndrome.  Supportive care outlined.  Explained that antibiotic ointment/gtts will not work on viral infections. Likely passed from wife to husband.  ? ?Obesity (BMI 30.0-34.9) ?He is frustrated since he is exercising regularly. Recent labs reviewed in detail with patient.  Thyroid,  A1c, fasting glucose and testosterone all normal.  Portion reduction recommended.  ? ?  ?I discussed the assessment and treatment plan with the patient. The patient was  provided an opportunity to ask questions and all were answered. The patient agreed with the plan and demonstrated an understanding of the instructions. ?  ?The patient was advised to call back or seek an in-person evaluation if the symptoms worsen or if the condition fails to improve as anticipated. ? ? ? ?Crecencio Mc, MD   ?

## 2022-03-14 NOTE — Assessment & Plan Note (Signed)
Likely due to allergies vs viral syndrome.  Supportive care outlined.  Explained that antibiotic ointment/gtts will not work on viral infections. Likely passed from wife to husband.  ?

## 2022-03-14 NOTE — Assessment & Plan Note (Signed)
He is frustrated since he is exercising regularly. Recent labs reviewed in detail with patient.  Thyroid,  A1c, fasting glucose and testosterone all normal.  Portion reduction recommended.  ?

## 2022-04-04 IMAGING — CR DG SHOULDER 2+V*L*
3 series · 3 of 3 positions shown · non-contrast
Comparison: None.

CLINICAL DATA: 34-year-old male status post fall, injury sledding.

EXAM:
LEFT SHOULDER - 2+ VIEW

[shoulder grashey]
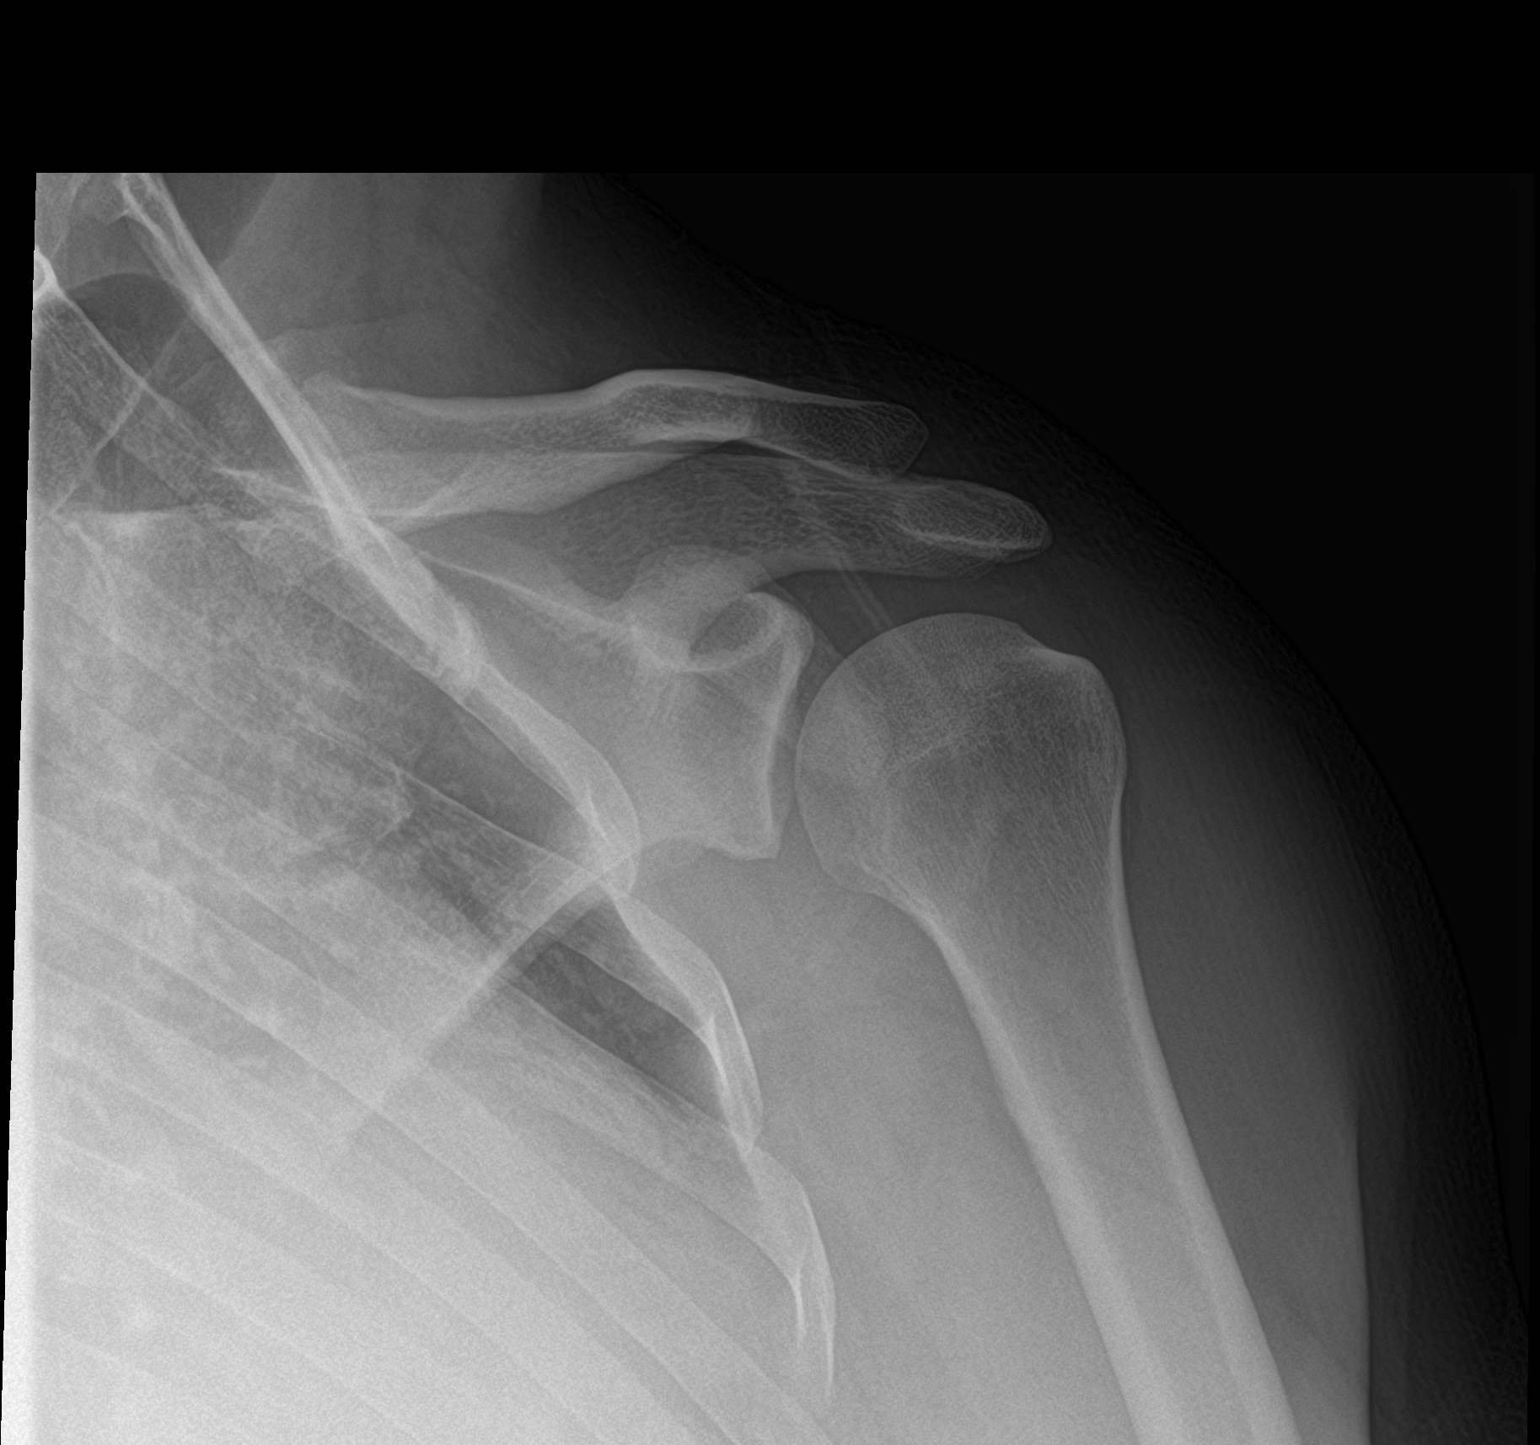

[shoulder y view]
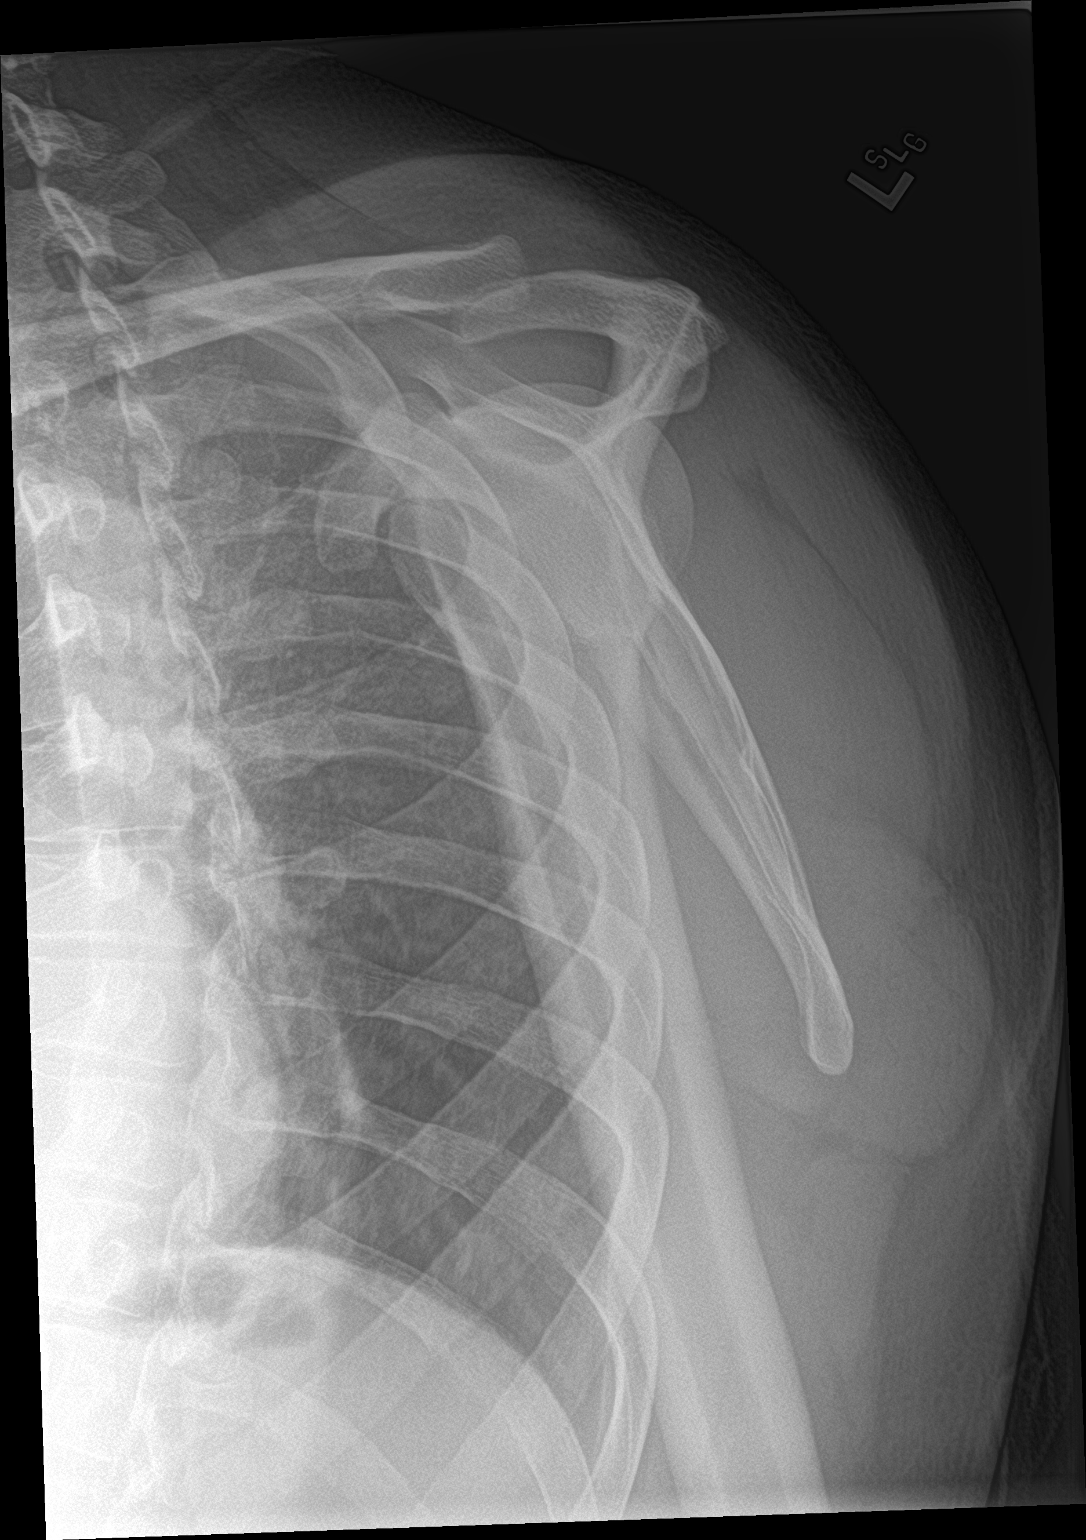

[shoulder axillary]
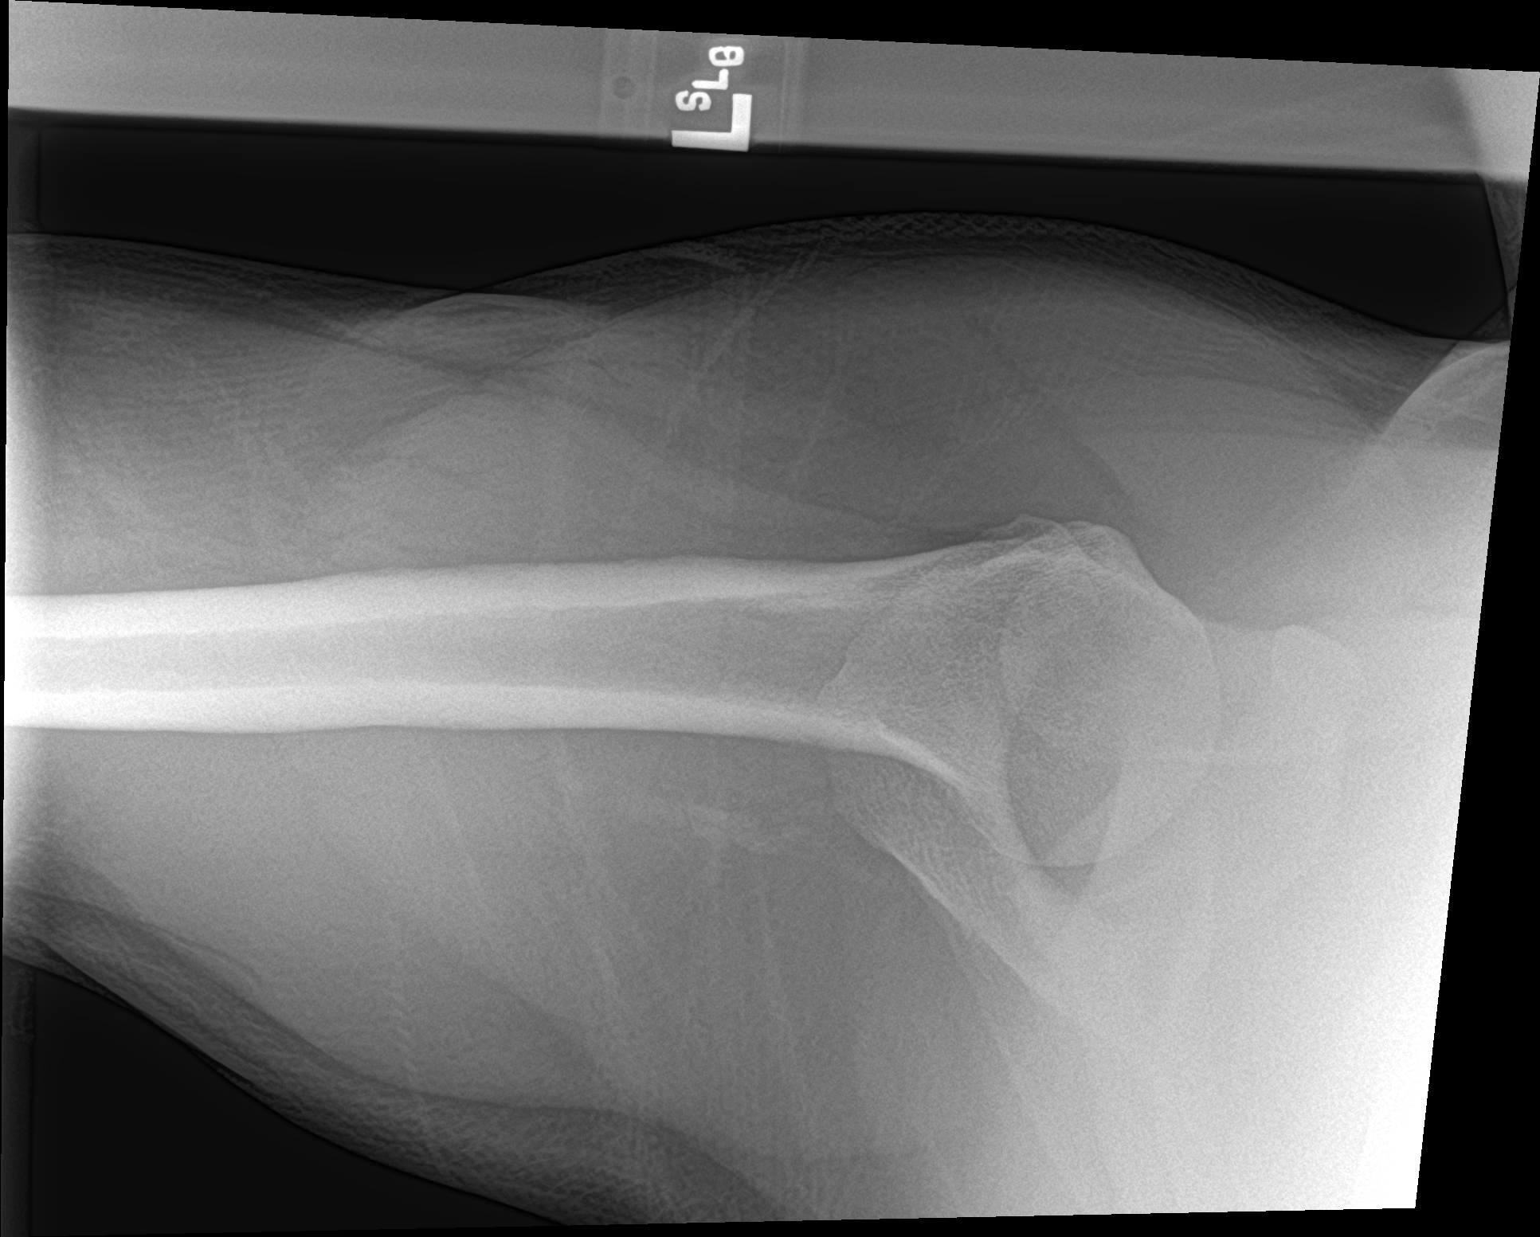

[3 of 3 positions shown; findings below may reference images not displayed]

FINDINGS: Bone mineralization is within normal limits. There is no evidence of
fracture or dislocation. There is no evidence of arthropathy or
other focal bone abnormality. Visible left ribs and chest appear
negative.
IMPRESSION: Negative.

## 2022-11-23 DIAGNOSIS — R430 Anosmia: Secondary | ICD-10-CM | POA: Diagnosis not present

## 2022-11-23 DIAGNOSIS — H9071 Mixed conductive and sensorineural hearing loss, unilateral, right ear, with unrestricted hearing on the contralateral side: Secondary | ICD-10-CM | POA: Diagnosis not present

## 2022-12-04 ENCOUNTER — Ambulatory Visit (INDEPENDENT_AMBULATORY_CARE_PROVIDER_SITE_OTHER): Payer: BC Managed Care – PPO | Admitting: Internal Medicine

## 2022-12-04 ENCOUNTER — Encounter: Payer: Self-pay | Admitting: Internal Medicine

## 2022-12-04 VITALS — BP 126/78 | HR 60 | Temp 98.5°F | Ht 67.0 in | Wt 238.6 lb

## 2022-12-04 DIAGNOSIS — Z7184 Encounter for health counseling related to travel: Secondary | ICD-10-CM

## 2022-12-04 DIAGNOSIS — E669 Obesity, unspecified: Secondary | ICD-10-CM | POA: Diagnosis not present

## 2022-12-04 DIAGNOSIS — R6882 Decreased libido: Secondary | ICD-10-CM

## 2022-12-04 LAB — COMPREHENSIVE METABOLIC PANEL
ALT: 25 U/L (ref 0–53)
AST: 25 U/L (ref 0–37)
Albumin: 4.7 g/dL (ref 3.5–5.2)
Alkaline Phosphatase: 75 U/L (ref 39–117)
BUN: 25 mg/dL — ABNORMAL HIGH (ref 6–23)
CO2: 28 mEq/L (ref 19–32)
Calcium: 9.8 mg/dL (ref 8.4–10.5)
Chloride: 101 mEq/L (ref 96–112)
Creatinine, Ser: 1.19 mg/dL (ref 0.40–1.50)
GFR: 77.61 mL/min (ref >60.00)
Glucose, Bld: 99 mg/dL (ref 70–99)
Potassium: 4.6 mEq/L (ref 3.5–5.1)
Sodium: 137 mEq/L (ref 135–145)
Total Bilirubin: 0.8 mg/dL (ref 0.2–1.2)
Total Protein: 7.4 g/dL (ref 6.0–8.3)

## 2022-12-04 LAB — CBC WITH DIFFERENTIAL/PLATELET
Basophils Absolute: 0 10*3/uL (ref 0.0–0.1)
Basophils Relative: 0.4 % (ref 0.0–3.0)
Eosinophils Absolute: 0.1 10*3/uL (ref 0.0–0.7)
Eosinophils Relative: 1.5 % (ref 0.0–5.0)
HCT: 47.1 % (ref 39.0–52.0)
Hemoglobin: 16.1 g/dL (ref 13.0–17.0)
Lymphocytes Relative: 33.9 % (ref 12.0–46.0)
Lymphs Abs: 1.9 10*3/uL (ref 0.7–4.0)
MCHC: 34.3 g/dL (ref 30.0–36.0)
MCV: 88.5 fl (ref 78.0–100.0)
Monocytes Absolute: 0.5 10*3/uL (ref 0.1–1.0)
Monocytes Relative: 8.5 % (ref 3.0–12.0)
Neutro Abs: 3.1 10*3/uL (ref 1.4–7.7)
Neutrophils Relative %: 55.7 % (ref 43.0–77.0)
Platelets: 249 10*3/uL (ref 150.0–400.0)
RBC: 5.32 Mil/uL (ref 4.22–5.81)
RDW: 13.3 % (ref 11.5–15.5)
WBC: 5.5 10*3/uL (ref 4.0–10.5)

## 2022-12-04 LAB — TSH: TSH: 1.67 u[IU]/mL (ref 0.35–5.50)

## 2022-12-04 LAB — LIPID PANEL
Cholesterol: 179 mg/dL (ref 0–200)
HDL: 60.7 mg/dL (ref >39.00)
LDL Cholesterol: 107 mg/dL — ABNORMAL HIGH (ref 0–99)
NonHDL: 118.21
Total CHOL/HDL Ratio: 3
Triglycerides: 57 mg/dL (ref 0.0–149.0)
VLDL: 11.4 mg/dL (ref 0.0–40.0)

## 2022-12-04 LAB — LDL CHOLESTEROL, DIRECT: Direct LDL: 103 mg/dL

## 2022-12-04 LAB — LUTEINIZING HORMONE: LH: 4.94 m[IU]/mL (ref 1.50–9.30)

## 2022-12-04 LAB — HEMOGLOBIN A1C: Hgb A1c MFr Bld: 5.2 % (ref 4.6–6.5)

## 2022-12-04 NOTE — Progress Notes (Signed)
Subjective:  Patient ID: Perry Morris, male    DOB: 1986/10/19  Age: 37 y.o. MRN: [id number]  CC: The primary encounter diagnosis was Low libido. Diagnoses of Obesity (BMI 30.0-34.9) and Travel advice encounter were also pertinent to this visit.   1) Working out 2 hours daily and building muscle but having trouble losing body fat. WONDERING if he has low testosterone or whether he should take Mounjaro.   Requesting to have his LH level and testosterone levels checked.  . Reviewed diet: eats candy cake, ice cream whenever he wants , but has also been very restrictive for periods as long as a month and only lost 1 lb.    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   No facility-administered medications prior to visit.    Review of Systems;  Patient denies headache, fevers, malaise, unintentional weight loss, skin rash, eye pain, sinus congestion and sinus pain, sore throat, dysphagia,  hemoptysis , cough, dyspnea, wheezing, chest pain, palpitations, orthopnea, edema, abdominal pain, nausea, melena, diarrhea, constipation, flank pain, dysuria, hematuria, urinary  Frequency, nocturia, numbness, tingling, seizures,  Focal weakness, Loss of consciousness,  Tremor, insomnia, depression, anxiety, and suicidal ideation.      Objective:  BP 126/78   Pulse 60   Temp 98.5 F (36.9 C) (Oral)   Ht 5\' 7"  (1.702 m)   Wt 238 lb 9.6 oz (108.2 kg)   SpO2 98%   BMI 37.37 kg/m   BP Readings from Last 3 Encounters:  12/04/22 126/78  01/20/22 116/72  06/23/21 116/68    Wt Readings from Last 3 Encounters:  12/04/22 238 lb 9.6 oz (108.2 kg)  03/14/22 228 lb (103.4 kg)  01/20/22 235 lb 9.6 oz (106.9 kg)    Physical Exam Vitals reviewed.  Constitutional:       General: He is not in acute distress.    Appearance: Normal appearance. He is normal weight. He is not ill-appearing, toxic-appearing or diaphoretic.  HENT:     Head: Normocephalic.  Eyes:     General: No scleral icterus.       Right eye: No discharge.        Left eye: No discharge.     Conjunctiva/sclera: Conjunctivae normal.  Cardiovascular:     Rate and Rhythm: Normal rate and regular rhythm.     Heart sounds: Normal heart sounds.  Pulmonary:     Effort: Pulmonary effort is normal. No respiratory distress.     Breath sounds: Normal breath sounds.  Musculoskeletal:        General: Normal range of motion.     Cervical back: Normal range of motion.  Skin:    General: Skin is warm and dry.  Neurological:     General: No focal deficit present.     Mental Status: He is alert and oriented to person, place, and time. Mental status is at baseline.  Psychiatric:        Mood and Affect: Mood normal.        Behavior: Behavior normal.        Thought Content: Thought content normal.        Judgment: Judgment normal.    Lab Results  Component Value Date   HGBA1C 5.2  01/20/2022   HGBA1C 5.2 06/23/2021   HGBA1C 5.4 06/25/2020    Lab Results  Component Value Date   CREATININE 1.22 01/20/2022   CREATININE 1.22 06/23/2021   CREATININE 1.17 05/27/2020    Lab Results  Component Value Date   WBC 6.6 01/20/2022   HGB 14.9 01/20/2022   HCT 44.4 01/20/2022   PLT 227.0 01/20/2022   GLUCOSE 84 01/20/2022   CHOL 168 01/20/2022   TRIG 44.0 01/20/2022   HDL 60.90 01/20/2022   LDLCALC 98 01/20/2022   ALT 25 01/20/2022   AST 36 01/20/2022   NA 137 01/20/2022   K 4.3 01/20/2022   CL 102 01/20/2022   CREATININE 1.22 01/20/2022   BUN 24 (H) 01/20/2022   CO2 26 01/20/2022   TSH 1.41 01/20/2022   PSA 0.97 06/05/2019   INR 0.91 07/12/2016   HGBA1C 5.2 01/20/2022    MR CERVICAL SPINE W WO CONTRAST  Result Date: 12/10/2020 CLINICAL DATA:  Abnormal marrow signal on MRI brain EXAM: MRI  CERVICAL SPINE WITHOUT AND WITH CONTRAST TECHNIQUE: Multiplanar and multiecho pulse sequences of the cervical spine, to include the craniocervical junction and cervicothoracic junction, were obtained without and with intravenous contrast. CONTRAST:  62mL GADAVIST GADOBUTROL 1 MMOL/ML IV SOLN COMPARISON:  None. FINDINGS: Alignment: Anteroposterior alignment is preserved. Vertebrae: Vertebral body heights are maintained. T1 marrow signal is mildly decreased. There is no focal suspicious osseous lesion. No abnormal enhancement. Cord: No abnormal signal.  No abnormal intrathecal enhancement. Posterior Fossa, vertebral arteries, paraspinal tissues: Unremarkable. Disc levels: Intervertebral disc heights are maintained. C2-C3:  No canal or foraminal stenosis. C3-C4: Minimal disc bulge. Mild left uncovertebral hypertrophy. No canal or foraminal stenosis. C4-C5: Minimal disc bulge. Mild left uncovertebral hypertrophy. No canal or right foraminal stenosis. Minor left foraminal stenosis. C5-C6: Minimal disc bulge. Left greater than right uncovertebral hypertrophy. No canal stenosis. Moderate right and marked left foraminal stenosis. C6-C7: Small central disc protrusion. No canal or foraminal stenosis. C7-T1:  No canal or foraminal stenosis. IMPRESSION: Mildly decreased T1 marrow signal as noted previously. May reflect hematopoietic marrow. Pathologic process is not favored. 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 Electronically Signed   By: Perry Morris M.D.   On: 12/10/2020 09:54    Assessment & Plan:  .Low libido -     Testosterone Total,Free,Bio, Males -     CBC with Differential/Platelet -     Luteinizing hormone  Obesity (BMI 30.0-34.9) Assessment & Plan: BMI IS INACCURATE in assessments of patients with increased muscle mass.  Counselling given . Screening labs done.   Orders: -     TSH -     Hemoglobin A1c -     Comprehensive metabolic panel -     Lipid panel -     LDL cholesterol, direct  Travel  advice encounter -     Ambulatory referral to [facility]     Follow-up: No follow-ups on file.   Perry Mc, MD

## 2022-12-04 NOTE — Assessment & Plan Note (Signed)
BMI IS INACCURATE in assessments of patients with increased muscle mass.  Counselling given . Screening labs done.

## 2022-12-04 NOTE — Patient Instructions (Signed)
Referral to Travel Clinic in progress for possible trip to [place]  LA and testosterone levels ordered

## 2022-12-05 ENCOUNTER — Encounter: Payer: Self-pay | Admitting: Internal Medicine

## 2022-12-05 LAB — TESTOSTERONE TOTAL,FREE,BIO, MALES
Albumin: 4.7 g/dL (ref 3.6–5.1)
Sex Hormone Binding: 19 nmol/L (ref 10–50)
Testosterone, Bioavailable: 152.8 ng/dL (ref 110.0–575.0)
Testosterone, Free: 71.3 pg/mL (ref 46.0–224.0)
Testosterone: 370 ng/dL (ref 250–827)

## 2022-12-06 ENCOUNTER — Encounter: Payer: Self-pay | Admitting: Internal Medicine

## 2022-12-06 ENCOUNTER — Telehealth (INDEPENDENT_AMBULATORY_CARE_PROVIDER_SITE_OTHER): Payer: BC Managed Care – PPO | Admitting: Internal Medicine

## 2022-12-06 VITALS — Ht 67.0 in | Wt 238.6 lb

## 2022-12-06 DIAGNOSIS — E669 Obesity, unspecified: Secondary | ICD-10-CM | POA: Diagnosis not present

## 2022-12-06 DIAGNOSIS — E785 Hyperlipidemia, unspecified: Secondary | ICD-10-CM

## 2022-12-06 NOTE — Progress Notes (Unsigned)
Virtual Visit via [place]   Note   This format is felt to be most appropriate for this patient at this time.  All issues noted in this document were discussed and addressed.  No physical exam was performed (except for noted visual exam findings with Video Visits).   I connected with  Ben  on 12/06/22 at  4:30 PM EST by a video enabled telemedicine application  and verified that I am speaking with the correct person using two identifiers. Location patient: home Location provider: work or home office Persons participating in the virtual visit: patient, provider  I discussed the limitations, risks, security and privacy concerns of performing an evaluation and management service by telephone and the availability of in person appointments. I also discussed with the patient that there may be a patient responsible charge related to this service. The patient expressed understanding and agreed to proceed.  Reason for visit: review test results   HPI:  37 yr old male returns for follow up on recent labs  1) HLD: he has no risk factors for CAD . No treatment advised  2) Concern for low testosterone:  recent labs,  serum total testosterone, normal FSH .  Reviewed previous testosterone levels    ROS: See pertinent positives and negatives per HPI.  Past Medical History:  Diagnosis Date   Exposure to trichomonas 03/21/2021   GERD (gastroesophageal reflux disease)    rare-no meds   Headache    h/o migraines    Past Surgical History:  Procedure Laterality Date   ORIF ANKLE FRACTURE Left 07/13/2016   Procedure: OPEN REDUCTION INTERNAL FIXATION (ORIF) ANKLE FRACTURE;  Surgeon: Thornton Park, MD;  Location: [facility] ORS;  Service: Orthopedics;  Laterality: Left;   WISDOM TOOTH EXTRACTION      Family History  Problem Relation Age of Onset   Stroke Father 55       thrombotic,  carotid artery    Cancer Brother 33       lymphoma    SOCIAL HX:  reports that he has never smoked. He has never used  smokeless tobacco. He reports that he does not drink alcohol and does not use drugs.   No current outpatient medications on file.  EXAM:  VITALS per patient if applicable:  GENERAL: alert, oriented, appears well and in no acute distress  HEENT: atraumatic, conjunttiva clear, no obvious abnormalities on inspection of external nose and ears  NECK: normal movements of the head and neck  LUNGS: on inspection no signs of respiratory distress, breathing rate appears normal, no obvious gross SOB, gasping or wheezing  CV: no obvious cyanosis  MS: moves all visible extremities without noticeable abnormality  PSYCH/NEURO: pleasant and cooperative, no obvious depression or anxiety, speech and thought processing grossly intact  ASSESSMENT AND PLAN: Obesity (BMI 30.0-34.9) Assessment & Plan: BMI IS INACCURATE in assessments of patients with increased muscle mass.  Counselling given . Screening lab  normal/ .    Dyslipidemia Assessment & Plan: No risk factors for CAD.  No treatment advised   Lab Results  Component Value Date   CHOL 179 12/04/2022   HDL 60.70 12/04/2022   LDLCALC 107 (H) 12/04/2022   LDLDIRECT 103.0 12/04/2022   TRIG 57.0 12/04/2022   CHOLHDL 3 12/04/2022          I discussed the assessment and treatment plan with the patient. The patient was provided an opportunity to ask questions and all were answered. The patient agreed with the plan and demonstrated an  understanding of the instructions.   The patient was advised to call back or seek an in-person evaluation if the symptoms worsen or if the condition fails to improve as anticipated.   I spent 20 minutes dedicated to the care of this patient on the date of this encounter to include pre-visit review of his medical history,  Face-to-face time with the patient , and post visit ordering of testing and therapeutics.    Crecencio Mc, MD

## 2022-12-07 DIAGNOSIS — E785 Hyperlipidemia, unspecified: Secondary | ICD-10-CM | POA: Insufficient documentation

## 2022-12-07 NOTE — Assessment & Plan Note (Signed)
No risk factors for CAD.  No treatment advised   Lab Results  Component Value Date   CHOL 179 12/04/2022   HDL 60.70 12/04/2022   LDLCALC 107 (H) 12/04/2022   LDLDIRECT 103.0 12/04/2022   TRIG 57.0 12/04/2022   CHOLHDL 3 12/04/2022

## 2022-12-07 NOTE — Assessment & Plan Note (Signed)
BMI IS INACCURATE in assessments of patients with increased muscle mass.  Counselling given . Screening lab  normal/ .

## 2023-02-26 DIAGNOSIS — H65111 Acute and subacute allergic otitis media (mucoid) (sanguinous) (serous), right ear: Secondary | ICD-10-CM | POA: Diagnosis not present

## 2023-03-21 DIAGNOSIS — H65111 Acute and subacute allergic otitis media (mucoid) (sanguinous) (serous), right ear: Secondary | ICD-10-CM | POA: Diagnosis not present

## 2023-03-21 DIAGNOSIS — J309 Allergic rhinitis, unspecified: Secondary | ICD-10-CM | POA: Diagnosis not present

## 2023-05-01 ENCOUNTER — Telehealth: Payer: Self-pay | Admitting: Urology

## 2023-05-01 MED ORDER — CLOMIPHENE CITRATE 50 MG PO TABS: 25.0000 mg | ORAL_TABLET | Freq: Every day | ORAL | 0 refills | Status: DC

## 2023-05-01 NOTE — Telephone Encounter (Signed)
Status post vasectomy 3 years ago.  Had questions regarding his testosterone level.  Total testosterone March 2023 was 477 and repeat testosterone 11/2022 was 370.  Free and bioavailable testosterone's were in the low normal range.  His LH was normal.  Does have some decreased libido and some difficulty losing body fat.  Discussed a trial of Clomid which will not suppress natural testosterone production.  We discussed this is used off label for low/low normal testosterone and he was interested in a 2-63-month trial.  Rx sent to pharmacy.  Schedule lab visit 8 weeks for repeat testosterone level

## 2023-05-12 DIAGNOSIS — M25512 Pain in left shoulder: Secondary | ICD-10-CM | POA: Diagnosis not present

## 2023-06-11 DIAGNOSIS — M19012 Primary osteoarthritis, left shoulder: Secondary | ICD-10-CM | POA: Diagnosis not present

## 2023-06-18 ENCOUNTER — Telehealth: Payer: Self-pay | Admitting: *Deleted

## 2023-06-18 NOTE — Telephone Encounter (Signed)
-----   Message from Verna Czech [facility] sent at 06/18/2023  3:56 PM EDT ----- Regarding: Lab visit Please schedule lab visit for a testosterone level in approximately 1 week

## 2023-06-18 NOTE — Telephone Encounter (Signed)
Left message on voice mail for patient to be scheduled for lab visit next week.

## 2023-06-29 ENCOUNTER — Encounter: Payer: Self-pay | Admitting: Urology

## 2023-07-02 ENCOUNTER — Encounter: Payer: Self-pay | Admitting: *Deleted

## 2023-07-04 DIAGNOSIS — M47812 Spondylosis without myelopathy or radiculopathy, cervical region: Secondary | ICD-10-CM | POA: Diagnosis not present

## 2023-07-04 DIAGNOSIS — S161XXA Strain of muscle, fascia and tendon at neck level, initial encounter: Secondary | ICD-10-CM | POA: Diagnosis not present

## 2023-07-04 DIAGNOSIS — M542 Cervicalgia: Secondary | ICD-10-CM | POA: Diagnosis not present

## 2023-07-05 ENCOUNTER — Other Ambulatory Visit: Payer: Self-pay

## 2023-07-05 ENCOUNTER — Other Ambulatory Visit: Payer: Managed Care, Other (non HMO)

## 2023-07-05 ENCOUNTER — Other Ambulatory Visit: Payer: BC Managed Care – PPO

## 2023-07-05 DIAGNOSIS — Z302 Encounter for sterilization: Secondary | ICD-10-CM | POA: Diagnosis not present

## 2023-07-06 LAB — TESTOSTERONE: Testosterone: 469 ng/dL (ref 264–916)

## 2023-11-26 DIAGNOSIS — H9071 Mixed conductive and sensorineural hearing loss, unilateral, right ear, with unrestricted hearing on the contralateral side: Secondary | ICD-10-CM | POA: Diagnosis not present

## 2024-03-11 ENCOUNTER — Encounter: Payer: Self-pay | Admitting: Internal Medicine

## 2024-04-14 ENCOUNTER — Ambulatory Visit: Admitting: Internal Medicine

## 2024-06-03 ENCOUNTER — Ambulatory Visit (INDEPENDENT_AMBULATORY_CARE_PROVIDER_SITE_OTHER): Admitting: Internal Medicine

## 2024-06-03 ENCOUNTER — Encounter: Payer: Self-pay | Admitting: Internal Medicine

## 2024-06-03 ENCOUNTER — Ambulatory Visit

## 2024-06-03 VITALS — BP 102/78 | HR 72 | Ht 67.0 in | Wt 231.0 lb

## 2024-06-03 DIAGNOSIS — Z6836 Body mass index (BMI) 36.0-36.9, adult: Secondary | ICD-10-CM

## 2024-06-03 DIAGNOSIS — E785 Hyperlipidemia, unspecified: Secondary | ICD-10-CM

## 2024-06-03 DIAGNOSIS — R5383 Other fatigue: Secondary | ICD-10-CM | POA: Diagnosis not present

## 2024-06-03 DIAGNOSIS — R351 Nocturia: Secondary | ICD-10-CM

## 2024-06-03 DIAGNOSIS — R0682 Tachypnea, not elsewhere classified: Secondary | ICD-10-CM

## 2024-06-03 DIAGNOSIS — G478 Other sleep disorders: Secondary | ICD-10-CM

## 2024-06-03 DIAGNOSIS — R7301 Impaired fasting glucose: Secondary | ICD-10-CM

## 2024-06-03 NOTE — Patient Instructions (Signed)
 The  cause for your nocturnal tachypnea is unclear  I have ordered screening labs and a chest x ray, along with an ECHO of your heart  I am advising that you have an echocardiogram. Echocardiography is a painless test that uses sound waves to create images of your heart. It provides us  with information about the size and shape of your heart and how well your heart's chambers and valves are working. This procedure takes approximately one hour. There are no restrictions for this procedure.

## 2024-06-03 NOTE — Progress Notes (Unsigned)
 Subjective:  Patient ID: Perry Morris, male    DOB: [birth year]  Age: 38 y.o. MRN: [id number]  CC: The primary encounter diagnosis was Dyslipidemia. Diagnoses of Other fatigue, Impaired fasting glucose, Tachypnea, Nocturia, BMI 36.0-36.9,adult, and Sleep-related neurogenic tachypnea were also pertinent to this visit.   HPI Perry Morris presents for  Chief Complaint  Patient presents with   Medical Management of Chronic Issues    Discuss the need for a sleep study   Healthy 38 yr old male presents with increased RR noted by his Apple WAtch during sleep with RR reaching 30/min .  Wakes up feeling refreshed .  No bad dreams.  No apneic events, no snoring observed by  wife.  Has  chronic stable nocturia x 2-3 but drinks a lot of water  and abel to return to sleep easily . Works out vigorously  6 days using  combination of HIT , running ,  weight lifting and biking.  Takes creatine and protein powder supplements but no other supplements.  Has requested testosterone  supplementation but levels have been repeatedly normal.  Denies symptoms during workouts.  .    Notes that he had vertigo after ankle surgery several years ago. Saw PT for the Epley Maneuver (done repeatedly over several weeks) and vertigo resolved.  Currently has very mild light headedness 1-2 times per month . Denies headaches,  sinus congestions.  Flies twice per month   some flights are up to 5 hours in duration .  Denies calf pain, leg swelling,  shortness of breath or chest pain .     Outpatient Medications Prior to Visit  Medication Sig Dispense Refill   clomiPHENE  (CLOMID ) 50 MG tablet Take 0.5 tablets (25 mg total) by mouth daily. (Patient not taking: Reported on 06/03/2024) 45 tablet 0   No facility-administered medications prior to visit.    Review of Systems;  Patient denies headache, fevers, malaise, unintentional weight loss, skin rash, eye pain, sinus congestion and sinus pain, sore throat, dysphagia,   hemoptysis , cough, dyspnea, wheezing, chest pain, palpitations, orthopnea, edema, abdominal pain, nausea, melena, diarrhea, constipation, flank pain, dysuria, hematuria, urinary  Frequency, nocturia, numbness, tingling, seizures,  Focal weakness, Loss of consciousness,  Tremor, insomnia, depression, anxiety, and suicidal ideation.      Objective:  BP 102/78   Pulse 72   Ht 5' 7 (1.702 m)   Wt 231 lb (104.8 kg)   SpO2 97%   BMI 36.18 kg/m   BP Readings from Last 3 Encounters:  06/03/24 102/78  12/04/22 126/78  01/20/22 116/72    Wt Readings from Last 3 Encounters:  06/03/24 231 lb (104.8 kg)  12/06/22 238 lb 9.6 oz (108.2 kg)  12/04/22 238 lb 9.6 oz (108.2 kg)    Physical Exam Vitals reviewed.  Constitutional:      General: He is not in acute distress.    Appearance: Normal appearance. He is normal weight. He is not ill-appearing, toxic-appearing or diaphoretic.     Comments: Extremely muscular build  HENT:     Head: Normocephalic.  Eyes:     General: No scleral icterus.       Right eye: No discharge.        Left eye: No discharge.     Conjunctiva/sclera: Conjunctivae normal.  Cardiovascular:     Rate and Rhythm: Normal rate and regular rhythm.     Heart sounds: Normal heart sounds.  Pulmonary:     Effort: Pulmonary effort is normal. No respiratory  distress.     Breath sounds: Normal breath sounds.  Musculoskeletal:        General: Normal range of motion.     Cervical back: Normal range of motion.  Skin:    General: Skin is warm and dry.  Neurological:     General: No focal deficit present.     Mental Status: He is alert and oriented to person, place, and time. Mental status is at baseline.  Psychiatric:        Mood and Affect: Mood normal.        Behavior: Behavior normal.        Thought Content: Thought content normal.        Judgment: Judgment normal.     Lab Results  Component Value Date   HGBA1C 5.2 12/04/2022   HGBA1C 5.2 01/20/2022   HGBA1C 5.2  06/23/2021    Lab Results  Component Value Date   CREATININE 1.19 12/04/2022   CREATININE 1.22 01/20/2022   CREATININE 1.22 06/23/2021    Lab Results  Component Value Date   WBC 5.5 12/04/2022   HGB 16.1 12/04/2022   HCT 47.1 12/04/2022   PLT 249.0 12/04/2022   GLUCOSE 99 12/04/2022   CHOL 179 12/04/2022   TRIG 57.0 12/04/2022   HDL 60.70 12/04/2022   LDLDIRECT 103.0 12/04/2022   LDLCALC 107 (H) 12/04/2022   ALT 25 12/04/2022   AST 25 12/04/2022   NA 137 12/04/2022   K 4.6 12/04/2022   CL 101 12/04/2022   CREATININE 1.19 12/04/2022   BUN 25 (H) 12/04/2022   CO2 28 12/04/2022   TSH 1.67 12/04/2022   PSA 0.97 06/05/2019   INR 0.91 07/12/2016   HGBA1C 5.2 12/04/2022    MR CERVICAL SPINE W WO CONTRAST Result Date: 12/10/2020 CLINICAL DATA:  Abnormal marrow signal on MRI brain EXAM: MRI CERVICAL SPINE WITHOUT AND WITH CONTRAST TECHNIQUE: Multiplanar and multiecho pulse sequences of the cervical spine, to include the craniocervical junction and cervicothoracic junction, were obtained without and with intravenous contrast. CONTRAST:  10mL GADAVIST  GADOBUTROL  1 MMOL/ML IV SOLN COMPARISON:  None. FINDINGS: Alignment: Anteroposterior alignment is preserved. Vertebrae: Vertebral body heights are maintained. T1 marrow signal is mildly decreased. There is no focal suspicious osseous lesion. No abnormal enhancement. Cord: No abnormal signal.  No abnormal intrathecal enhancement. Posterior Fossa, vertebral arteries, paraspinal tissues: Unremarkable. Disc levels: Intervertebral disc heights are maintained. C2-C3:  No canal or foraminal stenosis. C3-C4: Minimal disc bulge. Mild left uncovertebral hypertrophy. No canal or foraminal stenosis. C4-C5: Minimal disc bulge. Mild left uncovertebral hypertrophy. No canal or right foraminal stenosis. Minor left foraminal stenosis. C5-C6: Minimal disc bulge. Left greater than right uncovertebral hypertrophy. No canal stenosis. Moderate right and marked left  foraminal stenosis. C6-C7: Small central disc protrusion. No canal or foraminal stenosis. C7-T1:  No canal or foraminal stenosis. IMPRESSION: Mildly decreased T1 marrow signal as noted previously. May reflect hematopoietic marrow. Pathologic process is not favored. 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 Electronically Signed   By: Santina Blanch M.D.   On: 12/10/2020 09:54    Assessment & Plan:  .Dyslipidemia -     Lipid panel -     LDL cholesterol, direct  Other fatigue -     CBC with Differential/Platelet -     TSH  Impaired fasting glucose -     Comprehensive metabolic panel with GFR -     Hemoglobin A1c  Tachypnea -     EKG 12-Lead -     D-dimer,  quantitative -     DG Chest 2 View; Future -     ECHOCARDIOGRAM COMPLETE; Future  Nocturia Assessment & Plan: Chronic, secondary to excessive water intake.  Lytes are normal  Lab Results  Component Value Date   NA 137 12/04/2022   K 4.6 12/04/2022   CL 101 12/04/2022   CO2 28 12/04/2022      BMI 36.0-36.9,adult Assessment & Plan: BMI IS INACCURATE in assessments of patients with increased muscle mass.  Counselling given . Screening labs are repeatedly   normal/ .    Sleep-related neurogenic tachypnea Assessment & Plan: He has no signs or symptoms of OSA and no signs of metabolic acidosis. Checking  D Dimer,  chest  x ray and ECHO to evaluate for PE, hyperinflated lungs/asthma, and heart failure   Lab Results  Component Value Date   NA 137 12/04/2022   K 4.6 12/04/2022   CL 101 12/04/2022   CO2 28 12/04/2022         I spent 34 minutes on the day of this face to face encounter reviewing patient's  prior relevant surgical and non surgical procedures, recent  labs and imaging studies, counseling on weight management,  reviewing the assessment and plan with patient, and post visit ordering and reviewing of  diagnostics and therapeutics with patient  .   Follow-up: Return in about 3 months (around  09/03/2024).   Verneita LITTIE Kettering, MD

## 2024-06-04 DIAGNOSIS — R0682 Tachypnea, not elsewhere classified: Secondary | ICD-10-CM | POA: Insufficient documentation

## 2024-06-04 LAB — LIPID PANEL
Cholesterol: 198 mg/dL (ref 0–200)
HDL: 69.2 mg/dL (ref >39.00)
LDL Cholesterol: 104 mg/dL — ABNORMAL HIGH (ref 0–99)
NonHDL: 128.52
Total CHOL/HDL Ratio: 3
Triglycerides: 124 mg/dL (ref 0.0–149.0)
VLDL: 24.8 mg/dL (ref 0.0–40.0)

## 2024-06-04 LAB — CBC WITH DIFFERENTIAL/PLATELET
Basophils Absolute: 0.1 K/uL (ref 0.0–0.1)
Basophils Relative: 1.4 % (ref 0.0–3.0)
Eosinophils Absolute: 0.1 K/uL (ref 0.0–0.7)
Eosinophils Relative: 0.8 % (ref 0.0–5.0)
HCT: 46 % (ref 39.0–52.0)
Hemoglobin: 15.8 g/dL (ref 13.0–17.0)
Lymphocytes Relative: 18.3 % (ref 12.0–46.0)
Lymphs Abs: 1.7 K/uL (ref 0.7–4.0)
MCHC: 34.4 g/dL (ref 30.0–36.0)
MCV: 89.1 fl (ref 78.0–100.0)
Monocytes Absolute: 0.7 K/uL (ref 0.1–1.0)
Monocytes Relative: 7.2 % (ref 3.0–12.0)
Neutro Abs: 6.9 K/uL (ref 1.4–7.7)
Neutrophils Relative %: 72.3 % (ref 43.0–77.0)
Platelets: 238 K/uL (ref 150.0–400.0)
RBC: 5.16 Mil/uL (ref 4.22–5.81)
RDW: 13.3 % (ref 11.5–15.5)
WBC: 9.6 K/uL (ref 4.0–10.5)

## 2024-06-04 LAB — COMPREHENSIVE METABOLIC PANEL WITH GFR
ALT: 19 U/L (ref 0–53)
AST: 27 U/L (ref 0–37)
Albumin: 5 g/dL (ref 3.5–5.2)
Alkaline Phosphatase: 79 U/L (ref 39–117)
BUN: 29 mg/dL — ABNORMAL HIGH (ref 6–23)
CO2: 26 meq/L (ref 19–32)
Calcium: 10.3 mg/dL (ref 8.4–10.5)
Chloride: 100 meq/L (ref 96–112)
Creatinine, Ser: 1.44 mg/dL (ref 0.40–1.50)
GFR: 61.09 mL/min (ref >60.00)
Glucose, Bld: 76 mg/dL (ref 70–99)
Potassium: 4.3 meq/L (ref 3.5–5.1)
Sodium: 138 meq/L (ref 135–145)
Total Bilirubin: 0.8 mg/dL (ref 0.2–1.2)
Total Protein: 7.6 g/dL (ref 6.0–8.3)

## 2024-06-04 LAB — D-DIMER, QUANTITATIVE: D-Dimer, Quant: 0.26 ug{FEU}/mL (ref <0.50)

## 2024-06-04 LAB — HEMOGLOBIN A1C: Hgb A1c MFr Bld: 5.3 % (ref 4.6–6.5)

## 2024-06-04 LAB — LDL CHOLESTEROL, DIRECT: Direct LDL: 107 mg/dL

## 2024-06-04 LAB — TSH: TSH: 1.63 u[IU]/mL (ref 0.35–5.50)

## 2024-06-04 NOTE — Assessment & Plan Note (Addendum)
 He has no signs or symptoms of OSA and no signs of metabolic acidosis. Checking  D Dimer,  chest  x ray and ECHO to evaluate for PE, hyperinflated lungs/asthma, and heart failure   Lab Results  Component Value Date   NA 137 12/04/2022   K 4.6 12/04/2022   CL 101 12/04/2022   CO2 28 12/04/2022

## 2024-06-04 NOTE — Assessment & Plan Note (Signed)
 Chronic, secondary to excessive water intake.  Lytes are normal  Lab Results  Component Value Date   NA 137 12/04/2022   K 4.6 12/04/2022   CL 101 12/04/2022   CO2 28 12/04/2022

## 2024-06-04 NOTE — Assessment & Plan Note (Signed)
 BMI IS INACCURATE in assessments of patients with increased muscle mass.  Counselling given . Screening labs are repeatedly   normal/ .

## 2024-06-05 ENCOUNTER — Ambulatory Visit: Payer: Self-pay | Admitting: Internal Medicine

## 2024-06-06 ENCOUNTER — Encounter: Payer: Self-pay | Admitting: Internal Medicine

## 2024-07-31 ENCOUNTER — Telehealth: Payer: Self-pay

## 2024-07-31 NOTE — Telephone Encounter (Signed)
 Copied from CRM [id number]. Topic: Referral - Prior Authorization Question >> Jul 31, 2024 10:32 AM Anairis L wrote: Reason for CRM: Patient is calling in regarding echo, he never heard from anyone and we see his Shara has now expired. Can someone please renew referral and contact patient to schedule visit.

## 2024-07-31 NOTE — Telephone Encounter (Signed)
 Copied from CRM [id number]. Topic: Clinical - Medical Advice >> Jul 31, 2024 10:34 AM Anairis L wrote: Reason for CRM: Patient would like a call back from a nurse regarding new vertigo issues.

## 2024-08-01 NOTE — Telephone Encounter (Signed)
 Lvm for pt to give office a call back. Please triage pt when he returns call and schedule an appointment with any avail provider

## 2024-08-05 MED ORDER — MECLIZINE HCL 25 MG PO TABS: 25.0000 mg | ORAL_TABLET | Freq: Three times a day (TID) | ORAL | 0 refills | Status: AC | PRN

## 2024-08-05 NOTE — Addendum Note (Signed)
 Addended by: MARYLYNN VERNEITA CROME on: 08/05/2024 09:20 AM   Modules accepted: Orders

## 2024-08-05 NOTE — Telephone Encounter (Signed)
Spoke with pt to let him know that medication has been sent in.

## 2024-08-05 NOTE — Telephone Encounter (Signed)
 Spoke with pt and he stated that years ago he had vertigo and saw a doctor that did the epley maneuver and it worked great. He stated that he did not have any issues with vertigo until last week. Pt stated that he did the procedure himself several times last week and he has not had any issues with vertigo since. Pt stated that he talked with a physical therapist and he stated that doing the epley maneuver was fine but that he could ask his PCP with she would prescribe Meclizine  for pt to have on hand if needed. Pt stated that he is flying out tomorrow and would like to know if this could be prescribed before he leaves.

## 2024-09-04 ENCOUNTER — Ambulatory Visit: Admitting: Internal Medicine

## 2024-09-04 ENCOUNTER — Encounter: Payer: Self-pay | Admitting: Internal Medicine

## 2024-09-04 VITALS — BP 120/76 | HR 60 | Temp 98.4°F | Ht 67.0 in | Wt 231.6 lb

## 2024-09-04 DIAGNOSIS — I499 Cardiac arrhythmia, unspecified: Secondary | ICD-10-CM | POA: Diagnosis not present

## 2024-09-04 DIAGNOSIS — R0682 Tachypnea, not elsewhere classified: Secondary | ICD-10-CM

## 2024-09-04 DIAGNOSIS — R252 Cramp and spasm: Secondary | ICD-10-CM | POA: Diagnosis not present

## 2024-09-04 DIAGNOSIS — G478 Other sleep disorders: Secondary | ICD-10-CM | POA: Diagnosis not present

## 2024-09-04 DIAGNOSIS — Z6836 Body mass index (BMI) 36.0-36.9, adult: Secondary | ICD-10-CM | POA: Diagnosis not present

## 2024-09-04 LAB — COMPREHENSIVE METABOLIC PANEL WITH GFR
ALT: 30 U/L (ref 0–53)
AST: 45 U/L — ABNORMAL HIGH (ref 0–37)
Albumin: 5.1 g/dL (ref 3.5–5.2)
Alkaline Phosphatase: 68 U/L (ref 39–117)
BUN: 27 mg/dL — ABNORMAL HIGH (ref 6–23)
CO2: 28 meq/L (ref 19–32)
Calcium: 9.9 mg/dL (ref 8.4–10.5)
Chloride: 100 meq/L (ref 96–112)
Creatinine, Ser: 1.19 mg/dL (ref 0.40–1.50)
GFR: 76.66 mL/min (ref >60.00)
Glucose, Bld: 98 mg/dL (ref 70–99)
Potassium: 5 meq/L (ref 3.5–5.1)
Sodium: 137 meq/L (ref 135–145)
Total Bilirubin: 0.8 mg/dL (ref 0.2–1.2)
Total Protein: 7.7 g/dL (ref 6.0–8.3)

## 2024-09-04 LAB — MAGNESIUM: Magnesium: 2.2 mg/dL (ref 1.5–2.5)

## 2024-09-04 LAB — CK: Total CK: 973 U/L — ABNORMAL HIGH (ref 17–232)

## 2024-09-04 NOTE — Progress Notes (Signed)
 Subjective:  Patient ID: Perry Morris, male    DOB: 09-03-1986  Age: 38 y.o. MRN: [id number]  CC: The primary encounter diagnosis was Muscle cramps. Diagnoses of BMI 36.0-36.9,adult, Cardiac arrhythmia, unspecified cardiac arrhythmia type, and Sleep-related neurogenic tachypnea were also pertinent to this visit.   HPI Perry Morris presents for  Chief Complaint  Patient presents with   Medical Management of Chronic Issues    3 mth f/u   Last seen in July   workup for nocturnal tachynpnea noted by apple watch,  RR wll  go from 13 to 35 /min  during sleep , but pulse does not range below 41 to  above. Workup thus far has included  D Doer. labs and CXR all normal.  Sleep study offered but deferred. ECHO ordered in July.  Patient was never called  .   Nocturia occasionally 3 times per night    Episodic vertigo managed  in the past with Epley maneurver ,  last episode 3 weeks ago,  did the home eply maneuver repeatedly for 2 days  which helped.   Has meclizine .  But doesn't use it    Finished a 68 mile ride in the  [place] recently,  developed severe cramps despite pickle juice , resolved in 20 sec after taking sublingual salt tablets    Outpatient Medications Prior to Visit  Medication Sig Dispense Refill   meclizine  (ANTIVERT ) 25 MG tablet Take 1 tablet (25 mg total) by mouth 3 (three) times daily as needed for dizziness. 30 tablet 0   No facility-administered medications prior to visit.    Review of Systems;  Patient denies headache, fevers, malaise, unintentional weight loss, skin rash, eye pain, sinus congestion and sinus pain, sore throat, dysphagia,  hemoptysis , cough, dyspnea, wheezing, chest pain, palpitations, orthopnea, edema, abdominal pain, nausea, melena, diarrhea, constipation, flank pain, dysuria, hematuria, urinary  Frequency, nocturia, numbness, tingling, seizures,  Focal weakness, Loss of consciousness,  Tremor, insomnia, depression, anxiety,  and suicidal ideation.      Objective:  BP 120/76   Pulse 60   Temp 98.4 F (36.9 C) (Oral)   Ht 5' 7 (1.702 m)   Wt 231 lb 9.6 oz (105.1 kg)   SpO2 96%   BMI 36.27 kg/m   BP Readings from Last 3 Encounters:  09/04/24 120/76  06/03/24 102/78  12/04/22 126/78    Wt Readings from Last 3 Encounters:  09/04/24 231 lb 9.6 oz (105.1 kg)  06/03/24 231 lb (104.8 kg)  12/06/22 238 lb 9.6 oz (108.2 kg)    Physical Exam Vitals reviewed.  Constitutional:      General: He is not in acute distress.    Appearance: Normal appearance. He is normal weight. He is not ill-appearing, toxic-appearing or diaphoretic.  HENT:     Head: Normocephalic.  Eyes:     General: No scleral icterus.       Right eye: No discharge.        Left eye: No discharge.     Conjunctiva/sclera: Conjunctivae normal.  Cardiovascular:     Rate and Rhythm: Normal rate and regular rhythm.     Heart sounds: Normal heart sounds.  Pulmonary:     Effort: Pulmonary effort is normal. No respiratory distress.     Breath sounds: Normal breath sounds.  Musculoskeletal:        General: Normal range of motion.     Cervical back: Normal range of motion.  Skin:    General: Skin  is warm and dry.  Neurological:     General: No focal deficit present.     Mental Status: He is alert and oriented to person, place, and time. Mental status is at baseline.  Psychiatric:        Mood and Affect: Mood normal.        Behavior: Behavior normal.        Thought Content: Thought content normal.        Judgment: Judgment normal.     Lab Results  Component Value Date   HGBA1C 5.3 06/03/2024   HGBA1C 5.2 12/04/2022   HGBA1C 5.2 01/20/2022    Lab Results  Component Value Date   CREATININE 1.19 09/04/2024   CREATININE 1.44 06/03/2024   CREATININE 1.19 12/04/2022    Lab Results  Component Value Date   WBC 9.6 06/03/2024   HGB 15.8 06/03/2024   HCT 46.0 06/03/2024   PLT 238.0 06/03/2024   GLUCOSE 98 09/04/2024   CHOL 198  06/03/2024   TRIG 124.0 06/03/2024   HDL 69.20 06/03/2024   LDLDIRECT 107.0 06/03/2024   LDLCALC 104 (H) 06/03/2024   ALT 30 09/04/2024   AST 45 (H) 09/04/2024   NA 137 09/04/2024   K 5.0 09/04/2024   CL 100 09/04/2024   CREATININE 1.19 09/04/2024   BUN 27 (H) 09/04/2024   CO2 28 09/04/2024   TSH 1.63 06/03/2024   PSA 0.97 06/05/2019   INR 0.91 07/12/2016   HGBA1C 5.3 06/03/2024    MR CERVICAL SPINE W WO CONTRAST Result Date: 12/10/2020 CLINICAL DATA:  Abnormal marrow signal on MRI brain EXAM: MRI CERVICAL SPINE WITHOUT AND WITH CONTRAST TECHNIQUE: Multiplanar and multiecho pulse sequences of the cervical spine, to include the craniocervical junction and cervicothoracic junction, were obtained without and with intravenous contrast. CONTRAST:  10mL GADAVIST  GADOBUTROL  1 MMOL/ML IV SOLN COMPARISON:  None. FINDINGS: Alignment: Anteroposterior alignment is preserved. Vertebrae: Vertebral body heights are maintained. T1 marrow signal is mildly decreased. There is no focal suspicious osseous lesion. No abnormal enhancement. Cord: No abnormal signal.  No abnormal intrathecal enhancement. Posterior Fossa, vertebral arteries, paraspinal tissues: Unremarkable. Disc levels: Intervertebral disc heights are maintained. C2-C3:  No canal or foraminal stenosis. C3-C4: Minimal disc bulge. Mild left uncovertebral hypertrophy. No canal or foraminal stenosis. C4-C5: Minimal disc bulge. Mild left uncovertebral hypertrophy. No canal or right foraminal stenosis. Minor left foraminal stenosis. C5-C6: Minimal disc bulge. Left greater than right uncovertebral hypertrophy. No canal stenosis. Moderate right and marked left foraminal stenosis. C6-C7: Small central disc protrusion. No canal or foraminal stenosis. C7-T1:  No canal or foraminal stenosis. IMPRESSION: Mildly decreased T1 marrow signal as noted previously. May reflect hematopoietic marrow. Pathologic process is not favored. 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  Electronically Signed   By: Santina Blanch M.D.   On: 12/10/2020 09:54    Assessment & Plan:  .Muscle cramps -     Comprehensive metabolic panel with GFR -     CK -     Magnesium  BMI 36.0-36.9,adult Assessment & Plan: BMI IS INACCURATE in assessments of patients with increased muscle mass.  Counselling given . Screening labs are repeatedly   normal/ .    Cardiac arrhythmia, unspecified cardiac arrhythmia type Assessment & Plan: symptomss are very brief and not accompaneid by chest pain, dizziness,  Or presyncope.  I have ordered and reviewed a 12 lead EKG and find that there are no acute changes and patient is in sinus rhythm.   Ruling out metabolic causes.  Lab Results  Component Value Date   TSH 1.63 06/03/2024   Last metabolic panel Lab Results  Component Value Date   GLUCOSE 98 09/04/2024   NA 137 09/04/2024   K 5.0 09/04/2024   CL 100 09/04/2024   CO2 28 09/04/2024   BUN 27 (H) 09/04/2024   CREATININE 1.19 09/04/2024   GFRNONAA >60 07/12/2016   GFRAA >60 07/12/2016   CALCIUM 9.9 09/04/2024   PROT 7.7 09/04/2024   ALBUMIN 5.1 09/04/2024   BILITOT 0.8 09/04/2024   ALKPHOS 68 09/04/2024   AST 45 (H) 09/04/2024   ALT 30 09/04/2024   ANIONGAP 5 07/12/2016     Lab Results  Component Value Date   WBC 9.6 06/03/2024   HGB 15.8 06/03/2024   HCT 46.0 06/03/2024   MCV 89.1 06/03/2024   PLT 238.0 06/03/2024      Sleep-related neurogenic tachypnea Assessment & Plan: He has no signs or symptoms of OSA and no signs of metabolic acidosis. Awaiting evaluation with ECHO  Lab Results  Component Value Date   NA 137 09/04/2024   K 5.0 09/04/2024   CL 100 09/04/2024   CO2 28 09/04/2024        I personally spent a total of 32 minutes in the care of the patient today including preparing to see the patient, performing a medically appropriate exam/evaluation, counseling and educating, placing orders, referring and communicating with other health care professionals,  documenting clinical information in the EHR, and independently interpreting results. Follow-up: No follow-ups on file.   Perry LITTIE Kettering, MD

## 2024-09-05 ENCOUNTER — Ambulatory Visit: Payer: Self-pay | Admitting: Internal Medicine

## 2024-09-06 NOTE — Assessment & Plan Note (Signed)
 symptomss are very brief and not accompaneid by chest pain, dizziness,  Or presyncope.  I have ordered and reviewed a 12 lead EKG and find that there are no acute changes and patient is in sinus rhythm.   Ruling out metabolic causes.   Lab Results  Component Value Date   TSH 1.63 06/03/2024   Last metabolic panel Lab Results  Component Value Date   GLUCOSE 98 09/04/2024   NA 137 09/04/2024   K 5.0 09/04/2024   CL 100 09/04/2024   CO2 28 09/04/2024   BUN 27 (H) 09/04/2024   CREATININE 1.19 09/04/2024   GFRNONAA >60 07/12/2016   GFRAA >60 07/12/2016   CALCIUM 9.9 09/04/2024   PROT 7.7 09/04/2024   ALBUMIN 5.1 09/04/2024   BILITOT 0.8 09/04/2024   ALKPHOS 68 09/04/2024   AST 45 (H) 09/04/2024   ALT 30 09/04/2024   ANIONGAP 5 07/12/2016     Lab Results  Component Value Date   WBC 9.6 06/03/2024   HGB 15.8 06/03/2024   HCT 46.0 06/03/2024   MCV 89.1 06/03/2024   PLT 238.0 06/03/2024

## 2024-09-06 NOTE — Assessment & Plan Note (Signed)
 He has no signs or symptoms of OSA and no signs of metabolic acidosis. Awaiting evaluation with ECHO  Lab Results  Component Value Date   NA 137 09/04/2024   K 5.0 09/04/2024   CL 100 09/04/2024   CO2 28 09/04/2024

## 2024-09-06 NOTE — Assessment & Plan Note (Signed)
 BMI IS INACCURATE in assessments of patients with increased muscle mass.  Counselling given . Screening labs are repeatedly   normal/ .

## 2024-09-12 ENCOUNTER — Ambulatory Visit: Attending: Internal Medicine

## 2024-09-12 DIAGNOSIS — R0682 Tachypnea, not elsewhere classified: Secondary | ICD-10-CM

## 2024-09-12 LAB — ECHOCARDIOGRAM COMPLETE
AR max vel: 3.69 cm2
AV Area VTI: 3.55 cm2
AV Area mean vel: 3.59 cm2
AV Mean grad: 4 mmHg
AV Peak grad: 7.3 mmHg
Ao pk vel: 1.35 m/s
Area-P 1/2: 3.53 cm2
S' Lateral: 3.2 cm

## 2024-09-15 ENCOUNTER — Other Ambulatory Visit: Payer: Self-pay | Admitting: Internal Medicine

## 2024-09-15 DIAGNOSIS — Q208 Other congenital malformations of cardiac chambers and connections: Secondary | ICD-10-CM

## 2024-09-30 ENCOUNTER — Encounter: Payer: Self-pay | Admitting: Sleep Medicine

## 2024-09-30 ENCOUNTER — Ambulatory Visit: Admitting: Sleep Medicine

## 2024-09-30 VITALS — BP 118/70 | HR 71 | Temp 98.4°F | Ht 67.0 in | Wt 241.4 lb

## 2024-09-30 DIAGNOSIS — E669 Obesity, unspecified: Secondary | ICD-10-CM

## 2024-09-30 DIAGNOSIS — Z6837 Body mass index (BMI) 37.0-37.9, adult: Secondary | ICD-10-CM

## 2024-09-30 DIAGNOSIS — G4733 Obstructive sleep apnea (adult) (pediatric): Secondary | ICD-10-CM | POA: Diagnosis not present

## 2024-09-30 NOTE — Patient Instructions (Addendum)
 SABRA

## 2024-09-30 NOTE — Progress Notes (Signed)
 Name:Kaydn A Burger MRN: [id number] DOB: 1986/04/21   CHIEF COMPLAINT:  EXCESSIVE DAYTIME SLEEPINESS   HISTORY OF PRESENT ILLNESS: Mr. Scully is a 38 y.o. w/ a h/o obesity who present for c/o occasional snoring which has been present for several years. Reports tracking his respiratory rate recently at night which ranges from 13-32 bpm. Reports nocturnal awakenings due to nocturia, however does not have difficulty falling back to sleep. Reports significant weight changes. Denies morning headaches, RLS symptoms, dream enactment, cataplexy, hypnagogic or hypnapompic hallucinations. Denies a family history of sleep apnea. Denies drowsy driving. Drinks 1-2 cups of coffee daily, occasional alcohol use, denies tobacco or illicit drug use.   Bedtime 9 pm Sleep onset 5 mins Rise time 5 am   EPWORTH SLEEP SCORE     09/30/2024   10:17 AM  Results of the Epworth flowsheet  Sitting and reading 0  Watching TV 0  Sitting, inactive in a public place (e.g. a theatre or a meeting) 0  As a passenger in a car for an hour without a break 0  Lying down to rest in the afternoon when circumstances permit 1  Sitting and talking to someone 0  Sitting quietly after a lunch without alcohol 0  In a car, while stopped for a few minutes in traffic 0  Total score 1    PAST MEDICAL HISTORY :   has a past medical history of Exposure to trichomonas (03/21/2021), GERD (gastroesophageal reflux disease), and Headache.  has a past surgical history that includes Wisdom tooth extraction; ORIF ankle fracture (Left, 07/13/2016); and Fracture surgery (broken ankle in 2017). Prior to Admission medications   Medication Sig Start Date End Date Taking? Authorizing Provider  meclizine  (ANTIVERT ) 25 MG tablet Take 1 tablet (25 mg total) by mouth 3 (three) times daily as needed for dizziness. 08/05/24  Yes Marylynn Verneita CROME, MD   No Known Allergies  FAMILY HISTORY:  family history includes Cancer (age of onset: 38)  in his brother; Stroke (age of onset: 58) in his father. SOCIAL HISTORY:  reports that he has never smoked. He has never used smokeless tobacco. He reports that he does not drink alcohol and does not use drugs.   Review of Systems:  Gen:  Denies  fever, sweats, chills weight loss  HEENT: Denies blurred vision, double vision, ear pain, eye pain, hearing loss, nose bleeds, sore throat Cardiac:  No dizziness, chest pain or heaviness, chest tightness,edema, No JVD Resp:   No cough, -sputum production, -shortness of breath,-wheezing, -hemoptysis,  Gi: Denies swallowing difficulty, stomach pain, nausea or vomiting, diarrhea, constipation, bowel incontinence Gu:  Denies bladder incontinence, burning urine Ext:   Denies Joint pain, stiffness or swelling Skin: Denies  skin rash, easy bruising or bleeding or hives Endoc:  Denies polyuria, polydipsia , polyphagia or weight change Psych:   Denies depression, insomnia or hallucinations  Other:  All other systems negative  VITAL SIGNS: BP 118/70   Pulse 71   Temp 98.4 F (36.9 C)   Ht 5' 7 (1.702 m)   Wt 241 lb 6.4 oz (109.5 kg)   SpO2 98%   BMI 37.81 kg/m    Physical Examination:   General Appearance: No distress  EYES PERRLA, EOM intact.   NECK Supple, No JVD Pulmonary: normal breath sounds, No wheezing.  CardiovascularNormal S1,S2.  No m/r/g.   Abdomen: Benign, Soft, non-tender. Skin:   warm, no rashes, no ecchymosis  Extremities: normal, no cyanosis, clubbing. Neuro:without focal  findings,  speech normal  PSYCHIATRIC: Mood, affect within normal limits.   ASSESSMENT AND PLAN  OSA I suspect that OSA is likely present due to clinical presentation. Discussed the consequences of untreated sleep apnea. Advised not to drive drowsy for safety of patient and others. Will complete further evaluation with a home sleep study and follow up to review results.    Obesity Counseled patient on diet and lifestyle modification.     MEDICATION ADJUSTMENTS/LABS AND TESTS ORDERED: Recommend Sleep Study   Patient  satisfied with Plan of action and management. All questions answered  Follow up to review HST results and treatment plan.   I spent a total of 36 minutes reviewing chart data, face-to-face evaluation with the patient, counseling and coordination of care as detailed above.    Brazil Voytko, M.D.  Sleep Medicine [facility]

## 2024-11-03 ENCOUNTER — Encounter

## 2024-11-03 DIAGNOSIS — G4733 Obstructive sleep apnea (adult) (pediatric): Secondary | ICD-10-CM

## 2024-11-03 DIAGNOSIS — R069 Unspecified abnormalities of breathing: Secondary | ICD-10-CM | POA: Diagnosis not present

## 2024-11-03 DIAGNOSIS — G4730 Sleep apnea, unspecified: Secondary | ICD-10-CM | POA: Diagnosis not present

## 2024-11-12 ENCOUNTER — Ambulatory Visit: Payer: Self-pay

## 2024-11-18 ENCOUNTER — Encounter: Payer: Self-pay | Admitting: Internal Medicine

## 2024-11-18 ENCOUNTER — Telehealth: Admitting: Sleep Medicine

## 2024-11-18 DIAGNOSIS — E669 Obesity, unspecified: Secondary | ICD-10-CM | POA: Diagnosis not present

## 2024-11-18 DIAGNOSIS — G4733 Obstructive sleep apnea (adult) (pediatric): Secondary | ICD-10-CM

## 2024-11-18 NOTE — Progress Notes (Signed)
 "      Name:Perry Morris MRN: [id number] DOB: Feb 28, 1986   CHIEF COMPLAINT:  HST F/U   HISTORY OF PRESENT ILLNESS: Mr. Perry Morris is a 39 y.o. w/ a h/o obesity who presents virtually to follow up on HST results. The patient underwent HST which revealed moderate OSA (AHI 19, O2 nadir 87%).      EPWORTH SLEEP SCORE     09/30/2024   10:17 AM  Results of the Epworth flowsheet  Sitting and reading 0  Watching TV 0  Sitting, inactive in a public place (e.g. a theatre or a meeting) 0  As a passenger in a car for an hour without a break 0  Lying down to rest in the afternoon when circumstances permit 1  Sitting and talking to someone 0  Sitting quietly after a lunch without alcohol 0  In a car, while stopped for a few minutes in traffic 0  Total score 1    PAST MEDICAL HISTORY :   has a past medical history of Exposure to trichomonas (03/21/2021), GERD (gastroesophageal reflux disease), and Headache.  has a past surgical history that includes Wisdom tooth extraction; ORIF ankle fracture (Left, 07/13/2016); and Fracture surgery (broken ankle in 2017). Prior to Admission medications   Medication Sig Start Date End Date Taking? Authorizing Provider  meclizine  (ANTIVERT ) 25 MG tablet Take 1 tablet (25 mg total) by mouth 3 (three) times daily as needed for dizziness. 08/05/24  Yes Marylynn Verneita CROME, MD   No Known Allergies  FAMILY HISTORY:  family history includes Cancer (age of onset: 51) in his brother; Stroke (age of onset: 10) in his father. SOCIAL HISTORY:  reports that he has never smoked. He has never used smokeless tobacco. He reports that he does not drink alcohol and does not use drugs.   Review of Systems:  Gen:  Denies  fever, sweats, chills weight loss  HEENT: Denies blurred vision, double vision, ear pain, eye pain, hearing loss, nose bleeds, sore throat Cardiac:  No dizziness, chest pain or heaviness, chest tightness,edema, No JVD Resp:   No cough, -sputum  production, -shortness of breath,-wheezing, -hemoptysis,  Gi: Denies swallowing difficulty, stomach pain, nausea or vomiting, diarrhea, constipation, bowel incontinence Gu:  Denies bladder incontinence, burning urine Ext:   Denies Joint pain, stiffness or swelling Skin: Denies  skin rash, easy bruising or bleeding or hives Endoc:  Denies polyuria, polydipsia , polyphagia or weight change Psych:   Denies depression, insomnia or hallucinations  Other:  All other systems negative  VITAL SIGNS: Unavailable due to telehealth visit   Physical Examination:   General Appearance: No distress  EYES PERRLA, EOM intact.   NECK Supple, No JVD Pulmonary: normal breath sounds, No wheezing.  CardiovascularNormal S1,S2.  No m/r/g.   Abdomen: Benign, Soft, non-tender. Skin:   warm, no rashes, no ecchymosis  Extremities: normal, no cyanosis, clubbing. Neuro:without focal findings,  speech normal  PSYCHIATRIC: Mood, affect within normal limits.  Virtual Visit via Video Note   I connected with [name] on 11/18/24 at 11:45 EDT by a video enabled telemedicine application and verified that I am speaking with the correct person using two identifiers.   Location: Patient: Home Provider: Office   I discussed the limitations of evaluation and management by telemedicine and the availability of in person appointments. The patient expressed understanding and agreed to proceed.      ASSESSMENT AND PLAN  OSA Reviewed HST results with patient. Starting on APAP therapy set to 4-16  cm H2O. Discussed the consequences of untreated sleep apnea. Advised not to drive drowsy for safety of patient and others. Will follow up in 3 months.    Obesity Counseled patient on diet and lifestyle modification.    Patient  satisfied with Plan of action and management. All questions answered  I spent a total of 20 minutes reviewing chart data, face-to-face evaluation with the patient, counseling and coordination of  care as detailed above.    Ashantee Deupree, M.D.  Sleep Medicine [facility]        "

## 2024-12-04 NOTE — Addendum Note (Signed)
 Addended by: Kasandra Fehr on: 12/04/2024 12:29 PM   Modules accepted: Level of Service

## 2025-01-05 ENCOUNTER — Ambulatory Visit: Admitting: Internal Medicine
